# Patient Record
Sex: Male | Born: 1975 | Race: White | Hispanic: No | Marital: Married | Smoking: Never smoker
Health system: Southern US, Community
[De-identification: ages and names within clinical notes are randomized; demographics above are authoritative.]

## PROBLEM LIST (undated history)

## (undated) DIAGNOSIS — Z22322 Carrier or suspected carrier of Methicillin resistant Staphylococcus aureus: Secondary | ICD-10-CM

## (undated) DIAGNOSIS — B1920 Unspecified viral hepatitis C without hepatic coma: Secondary | ICD-10-CM

## (undated) DIAGNOSIS — E669 Obesity, unspecified: Secondary | ICD-10-CM

## (undated) HISTORY — PX: ABDOMINAL SURGERY: SHX537

## (undated) HISTORY — PX: CARPAL TUNNEL RELEASE: SHX101

## (undated) HISTORY — PX: FACIAL RECONSTRUCTION SURGERY: SHX631

---

## 2001-07-02 ENCOUNTER — Emergency Department (HOSPITAL_COMMUNITY): Admission: EM | Admit: 2001-07-02 | Discharge: 2001-07-02 | Payer: Self-pay | Admitting: *Deleted

## 2002-01-29 ENCOUNTER — Emergency Department (HOSPITAL_COMMUNITY): Admission: EM | Admit: 2002-01-29 | Discharge: 2002-01-30 | Payer: Self-pay | Admitting: Emergency Medicine

## 2002-08-09 ENCOUNTER — Emergency Department (HOSPITAL_COMMUNITY): Admission: EM | Admit: 2002-08-09 | Discharge: 2002-08-09 | Payer: Self-pay | Admitting: Emergency Medicine

## 2002-08-10 ENCOUNTER — Encounter (HOSPITAL_COMMUNITY): Admission: RE | Admit: 2002-08-10 | Discharge: 2002-09-09 | Payer: Self-pay | Admitting: Preventative Medicine

## 2002-08-11 ENCOUNTER — Emergency Department (HOSPITAL_COMMUNITY): Admission: EM | Admit: 2002-08-11 | Discharge: 2002-08-11 | Payer: Self-pay | Admitting: *Deleted

## 2002-12-08 ENCOUNTER — Emergency Department (HOSPITAL_COMMUNITY): Admission: EM | Admit: 2002-12-08 | Discharge: 2002-12-09 | Payer: Self-pay | Admitting: Emergency Medicine

## 2002-12-09 ENCOUNTER — Encounter: Payer: Self-pay | Admitting: Emergency Medicine

## 2003-04-07 ENCOUNTER — Emergency Department (HOSPITAL_COMMUNITY): Admission: EM | Admit: 2003-04-07 | Discharge: 2003-04-07 | Payer: Self-pay | Admitting: Internal Medicine

## 2003-06-02 ENCOUNTER — Emergency Department (HOSPITAL_COMMUNITY): Admission: EM | Admit: 2003-06-02 | Discharge: 2003-06-02 | Payer: Self-pay | Admitting: Emergency Medicine

## 2004-09-06 ENCOUNTER — Emergency Department (HOSPITAL_COMMUNITY): Admission: EM | Admit: 2004-09-06 | Discharge: 2004-09-06 | Payer: Self-pay | Admitting: Emergency Medicine

## 2004-10-07 ENCOUNTER — Emergency Department (HOSPITAL_COMMUNITY): Admission: EM | Admit: 2004-10-07 | Discharge: 2004-10-07 | Payer: Self-pay | Admitting: Emergency Medicine

## 2004-12-27 ENCOUNTER — Emergency Department (HOSPITAL_COMMUNITY): Admission: EM | Admit: 2004-12-27 | Discharge: 2004-12-27 | Payer: Self-pay | Admitting: Emergency Medicine

## 2004-12-29 ENCOUNTER — Emergency Department (HOSPITAL_COMMUNITY): Admission: EM | Admit: 2004-12-29 | Discharge: 2004-12-29 | Payer: Self-pay | Admitting: Emergency Medicine

## 2005-02-09 ENCOUNTER — Emergency Department (HOSPITAL_COMMUNITY): Admission: EM | Admit: 2005-02-09 | Discharge: 2005-02-09 | Payer: Self-pay | Admitting: Emergency Medicine

## 2005-08-20 ENCOUNTER — Emergency Department (HOSPITAL_COMMUNITY): Admission: EM | Admit: 2005-08-20 | Discharge: 2005-08-20 | Payer: Self-pay | Admitting: Emergency Medicine

## 2006-02-10 ENCOUNTER — Emergency Department (HOSPITAL_COMMUNITY): Admission: EM | Admit: 2006-02-10 | Discharge: 2006-02-11 | Payer: Self-pay | Admitting: Emergency Medicine

## 2006-02-26 ENCOUNTER — Emergency Department (HOSPITAL_COMMUNITY): Admission: EM | Admit: 2006-02-26 | Discharge: 2006-02-26 | Payer: Self-pay | Admitting: Emergency Medicine

## 2006-05-16 ENCOUNTER — Emergency Department (HOSPITAL_COMMUNITY): Admission: EM | Admit: 2006-05-16 | Discharge: 2006-05-16 | Payer: Self-pay | Admitting: Emergency Medicine

## 2006-06-22 ENCOUNTER — Emergency Department (HOSPITAL_COMMUNITY): Admission: EM | Admit: 2006-06-22 | Discharge: 2006-06-23 | Payer: Self-pay | Admitting: Emergency Medicine

## 2006-06-25 ENCOUNTER — Emergency Department (HOSPITAL_COMMUNITY): Admission: EM | Admit: 2006-06-25 | Discharge: 2006-06-25 | Payer: Self-pay | Admitting: Emergency Medicine

## 2006-07-08 ENCOUNTER — Ambulatory Visit: Payer: Self-pay | Admitting: Orthopedic Surgery

## 2006-07-24 ENCOUNTER — Emergency Department (HOSPITAL_COMMUNITY): Admission: EM | Admit: 2006-07-24 | Discharge: 2006-07-24 | Payer: Self-pay | Admitting: Emergency Medicine

## 2006-08-04 ENCOUNTER — Emergency Department (HOSPITAL_COMMUNITY): Admission: EM | Admit: 2006-08-04 | Discharge: 2006-08-04 | Payer: Self-pay | Admitting: Emergency Medicine

## 2006-08-13 ENCOUNTER — Emergency Department (HOSPITAL_COMMUNITY): Admission: EM | Admit: 2006-08-13 | Discharge: 2006-08-13 | Payer: Self-pay | Admitting: Emergency Medicine

## 2006-08-22 ENCOUNTER — Emergency Department (HOSPITAL_COMMUNITY): Admission: EM | Admit: 2006-08-22 | Discharge: 2006-08-22 | Payer: Self-pay | Admitting: Emergency Medicine

## 2006-09-17 ENCOUNTER — Emergency Department (HOSPITAL_COMMUNITY): Admission: EM | Admit: 2006-09-17 | Discharge: 2006-09-17 | Payer: Self-pay | Admitting: Emergency Medicine

## 2006-09-20 ENCOUNTER — Emergency Department (HOSPITAL_COMMUNITY): Admission: EM | Admit: 2006-09-20 | Discharge: 2006-09-21 | Payer: Self-pay | Admitting: Emergency Medicine

## 2006-09-22 ENCOUNTER — Inpatient Hospital Stay (HOSPITAL_COMMUNITY): Admission: EM | Admit: 2006-09-22 | Discharge: 2006-09-30 | Payer: Self-pay | Admitting: Emergency Medicine

## 2006-09-22 ENCOUNTER — Ambulatory Visit: Payer: Self-pay | Admitting: Orthopedic Surgery

## 2006-10-05 ENCOUNTER — Encounter (HOSPITAL_COMMUNITY): Admission: RE | Admit: 2006-10-05 | Discharge: 2006-11-04 | Payer: Self-pay | Admitting: Orthopedic Surgery

## 2006-10-08 ENCOUNTER — Ambulatory Visit: Payer: Self-pay | Admitting: Orthopedic Surgery

## 2006-10-29 ENCOUNTER — Ambulatory Visit: Payer: Self-pay | Admitting: Orthopedic Surgery

## 2006-11-30 ENCOUNTER — Emergency Department (HOSPITAL_COMMUNITY): Admission: EM | Admit: 2006-11-30 | Discharge: 2006-11-30 | Payer: Self-pay | Admitting: Emergency Medicine

## 2006-12-07 ENCOUNTER — Emergency Department (HOSPITAL_COMMUNITY): Admission: EM | Admit: 2006-12-07 | Discharge: 2006-12-07 | Payer: Self-pay | Admitting: Emergency Medicine

## 2006-12-31 ENCOUNTER — Emergency Department (HOSPITAL_COMMUNITY): Admission: EM | Admit: 2006-12-31 | Discharge: 2006-12-31 | Payer: Self-pay | Admitting: *Deleted

## 2007-08-09 ENCOUNTER — Emergency Department (HOSPITAL_COMMUNITY): Admission: EM | Admit: 2007-08-09 | Discharge: 2007-08-09 | Payer: Self-pay | Admitting: Emergency Medicine

## 2007-09-13 ENCOUNTER — Emergency Department (HOSPITAL_COMMUNITY): Admission: EM | Admit: 2007-09-13 | Discharge: 2007-09-13 | Payer: Self-pay | Admitting: Emergency Medicine

## 2007-10-15 ENCOUNTER — Encounter
Admission: RE | Admit: 2007-10-15 | Discharge: 2007-10-17 | Payer: Self-pay | Admitting: Physical Medicine & Rehabilitation

## 2007-10-17 ENCOUNTER — Ambulatory Visit: Payer: Self-pay | Admitting: Physical Medicine & Rehabilitation

## 2007-12-30 ENCOUNTER — Emergency Department (HOSPITAL_COMMUNITY): Admission: EM | Admit: 2007-12-30 | Discharge: 2007-12-30 | Payer: Self-pay | Admitting: Emergency Medicine

## 2008-02-19 ENCOUNTER — Emergency Department (HOSPITAL_COMMUNITY): Admission: EM | Admit: 2008-02-19 | Discharge: 2008-02-20 | Payer: Self-pay | Admitting: Emergency Medicine

## 2008-02-21 ENCOUNTER — Emergency Department (HOSPITAL_COMMUNITY): Admission: EM | Admit: 2008-02-21 | Discharge: 2008-02-21 | Payer: Self-pay | Admitting: Emergency Medicine

## 2008-03-02 ENCOUNTER — Emergency Department (HOSPITAL_COMMUNITY): Admission: EM | Admit: 2008-03-02 | Discharge: 2008-03-02 | Payer: Self-pay | Admitting: Emergency Medicine

## 2008-03-11 ENCOUNTER — Emergency Department (HOSPITAL_COMMUNITY): Admission: EM | Admit: 2008-03-11 | Discharge: 2008-03-11 | Payer: Self-pay | Admitting: Emergency Medicine

## 2008-04-05 ENCOUNTER — Emergency Department (HOSPITAL_COMMUNITY): Admission: EM | Admit: 2008-04-05 | Discharge: 2008-04-05 | Payer: Self-pay | Admitting: Emergency Medicine

## 2008-04-14 ENCOUNTER — Ambulatory Visit: Admission: RE | Admit: 2008-04-14 | Discharge: 2008-04-14 | Payer: Self-pay | Admitting: Neurology

## 2008-04-27 ENCOUNTER — Emergency Department (HOSPITAL_COMMUNITY): Admission: EM | Admit: 2008-04-27 | Discharge: 2008-04-27 | Payer: Self-pay | Admitting: Emergency Medicine

## 2008-06-10 ENCOUNTER — Ambulatory Visit (HOSPITAL_COMMUNITY): Admission: RE | Admit: 2008-06-10 | Discharge: 2008-06-10 | Payer: Self-pay | Admitting: Neurology

## 2008-09-24 ENCOUNTER — Emergency Department (HOSPITAL_COMMUNITY): Admission: EM | Admit: 2008-09-24 | Discharge: 2008-09-24 | Payer: Self-pay | Admitting: Emergency Medicine

## 2009-03-12 ENCOUNTER — Emergency Department (HOSPITAL_COMMUNITY): Admission: EM | Admit: 2009-03-12 | Discharge: 2009-03-12 | Payer: Self-pay | Admitting: Emergency Medicine

## 2009-03-25 ENCOUNTER — Emergency Department (HOSPITAL_COMMUNITY): Admission: EM | Admit: 2009-03-25 | Discharge: 2009-03-25 | Payer: Self-pay | Admitting: Emergency Medicine

## 2009-03-26 ENCOUNTER — Emergency Department (HOSPITAL_COMMUNITY): Admission: EM | Admit: 2009-03-26 | Discharge: 2009-03-26 | Payer: Self-pay | Admitting: Emergency Medicine

## 2009-03-27 ENCOUNTER — Inpatient Hospital Stay (HOSPITAL_COMMUNITY): Admission: EM | Admit: 2009-03-27 | Discharge: 2009-03-31 | Payer: Self-pay | Admitting: Emergency Medicine

## 2009-08-14 ENCOUNTER — Emergency Department (HOSPITAL_COMMUNITY): Admission: EM | Admit: 2009-08-14 | Discharge: 2009-08-14 | Payer: Self-pay | Admitting: Emergency Medicine

## 2009-09-21 ENCOUNTER — Emergency Department (HOSPITAL_COMMUNITY): Admission: EM | Admit: 2009-09-21 | Discharge: 2009-09-21 | Payer: Self-pay | Admitting: Emergency Medicine

## 2009-10-02 ENCOUNTER — Emergency Department (HOSPITAL_COMMUNITY): Admission: EM | Admit: 2009-10-02 | Discharge: 2009-10-02 | Payer: Self-pay | Admitting: Emergency Medicine

## 2010-04-01 ENCOUNTER — Emergency Department (HOSPITAL_COMMUNITY): Admission: EM | Admit: 2010-04-01 | Discharge: 2010-04-01 | Payer: Self-pay | Admitting: Emergency Medicine

## 2010-05-09 ENCOUNTER — Emergency Department (HOSPITAL_COMMUNITY)
Admission: EM | Admit: 2010-05-09 | Discharge: 2010-05-09 | Payer: Self-pay | Source: Home / Self Care | Admitting: Emergency Medicine

## 2010-06-19 ENCOUNTER — Encounter: Payer: Self-pay | Admitting: Neurology

## 2010-06-20 ENCOUNTER — Emergency Department (HOSPITAL_COMMUNITY)
Admission: EM | Admit: 2010-06-20 | Discharge: 2010-06-20 | Payer: Self-pay | Source: Home / Self Care | Admitting: Emergency Medicine

## 2010-08-17 ENCOUNTER — Emergency Department (HOSPITAL_COMMUNITY)
Admission: EM | Admit: 2010-08-17 | Discharge: 2010-08-17 | Disposition: A | Discharge status: Home / Self Care | Payer: Medicaid Other | Attending: Emergency Medicine | Admitting: Emergency Medicine

## 2010-08-17 ENCOUNTER — Emergency Department (HOSPITAL_COMMUNITY): Payer: Medicaid Other

## 2010-08-17 DIAGNOSIS — IMO0002 Reserved for concepts with insufficient information to code with codable children: Secondary | ICD-10-CM | POA: Insufficient documentation

## 2010-08-17 DIAGNOSIS — J45909 Unspecified asthma, uncomplicated: Secondary | ICD-10-CM | POA: Insufficient documentation

## 2010-08-17 DIAGNOSIS — G8929 Other chronic pain: Secondary | ICD-10-CM | POA: Insufficient documentation

## 2010-08-17 DIAGNOSIS — I10 Essential (primary) hypertension: Secondary | ICD-10-CM | POA: Insufficient documentation

## 2010-08-17 DIAGNOSIS — F1910 Other psychoactive substance abuse, uncomplicated: Secondary | ICD-10-CM | POA: Insufficient documentation

## 2010-08-17 DIAGNOSIS — G4730 Sleep apnea, unspecified: Secondary | ICD-10-CM | POA: Insufficient documentation

## 2010-08-17 LAB — BASIC METABOLIC PANEL
Calcium: 9.2 mg/dL (ref 8.4–10.5)
GFR calc Af Amer: >60 mL/min (ref >60)
GFR calc non Af Amer: >60 mL/min (ref >60)
Potassium: 3.6 mEq/L (ref 3.5–5.1)
Sodium: 134 mEq/L — ABNORMAL LOW (ref 135–145)

## 2010-08-17 LAB — CBC
HCT: 37.5 % — ABNORMAL LOW (ref 39.0–52.0)
MCV: 83.3 fL (ref 78.0–100.0)
RBC: 4.5 MIL/uL (ref 4.22–5.81)
RDW: 13.5 % (ref 11.5–15.5)
WBC: 5.6 10*3/uL (ref 4.0–10.5)

## 2010-08-17 LAB — DIFFERENTIAL
Basophils Absolute: 0 10*3/uL (ref 0.0–0.1)
Lymphocytes Relative: 23 % (ref 12–46)
Lymphs Abs: 1.3 10*3/uL (ref 0.7–4.0)
Neutro Abs: 3.7 10*3/uL (ref 1.7–7.7)
Neutrophils Relative %: 66 % (ref 43–77)

## 2010-08-20 LAB — URINALYSIS, ROUTINE W REFLEX MICROSCOPIC
Glucose, UA: NEGATIVE mg/dL
Ketones, ur: NEGATIVE mg/dL
Protein, ur: NEGATIVE mg/dL
Urobilinogen, UA: 0.2 mg/dL (ref 0.0–1.0)

## 2010-08-20 LAB — URINE MICROSCOPIC-ADD ON

## 2010-08-20 LAB — WOUND CULTURE: Culture: NO GROWTH

## 2010-08-30 LAB — BASIC METABOLIC PANEL
BUN: 9 mg/dL (ref 6–23)
CO2: 24 mEq/L (ref 19–32)
CO2: 27 mEq/L (ref 19–32)
Calcium: 9.3 mg/dL (ref 8.4–10.5)
Chloride: 101 mEq/L (ref 96–112)
Chloride: 103 mEq/L (ref 96–112)
Chloride: 104 mEq/L (ref 96–112)
GFR calc Af Amer: >60 mL/min (ref >60)
GFR calc Af Amer: >60 mL/min (ref >60)
Glucose, Bld: 103 mg/dL — ABNORMAL HIGH (ref 70–99)
Glucose, Bld: 126 mg/dL — ABNORMAL HIGH (ref 70–99)
Glucose, Bld: 137 mg/dL — ABNORMAL HIGH (ref 70–99)
Potassium: 3.5 mEq/L (ref 3.5–5.1)
Potassium: 4.1 mEq/L (ref 3.5–5.1)
Sodium: 133 mEq/L — ABNORMAL LOW (ref 135–145)
Sodium: 137 mEq/L (ref 135–145)

## 2010-08-30 LAB — CULTURE, ROUTINE-ABSCESS

## 2010-08-30 LAB — MRSA CULTURE

## 2010-08-30 LAB — CBC
HCT: 36.3 % — ABNORMAL LOW (ref 39.0–52.0)
HCT: 36.4 % — ABNORMAL LOW (ref 39.0–52.0)
HCT: 38 % — ABNORMAL LOW (ref 39.0–52.0)
Hemoglobin: 12.5 g/dL — ABNORMAL LOW (ref 13.0–17.0)
Hemoglobin: 13.1 g/dL (ref 13.0–17.0)
MCHC: 34.4 g/dL (ref 30.0–36.0)
MCHC: 34.4 g/dL (ref 30.0–36.0)
MCHC: 34.4 g/dL (ref 30.0–36.0)
MCV: 89.5 fL (ref 78.0–100.0)
MCV: 89.8 fL (ref 78.0–100.0)
Platelets: 214 10*3/uL (ref 150–400)
RBC: 4.06 MIL/uL — ABNORMAL LOW (ref 4.22–5.81)
RBC: 4.23 MIL/uL (ref 4.22–5.81)
RDW: 12.5 % (ref 11.5–15.5)
RDW: 12.6 % (ref 11.5–15.5)

## 2010-08-30 LAB — DIFFERENTIAL
Basophils Absolute: 0 10*3/uL (ref 0.0–0.1)
Basophils Absolute: 0 10*3/uL (ref 0.0–0.1)
Basophils Relative: 0 % (ref 0–1)
Basophils Relative: 0 % (ref 0–1)
Basophils Relative: 0 % (ref 0–1)
Eosinophils Absolute: 0.2 10*3/uL (ref 0.0–0.7)
Eosinophils Absolute: 0.3 10*3/uL (ref 0.0–0.7)
Eosinophils Relative: 2 % (ref 0–5)
Eosinophils Relative: 2 % (ref 0–5)
Eosinophils Relative: 4 % (ref 0–5)
Monocytes Absolute: 0.5 10*3/uL (ref 0.1–1.0)
Monocytes Absolute: 0.7 10*3/uL (ref 0.1–1.0)
Monocytes Relative: 7 % (ref 3–12)
Monocytes Relative: 8 % (ref 3–12)
Neutro Abs: 7.2 10*3/uL (ref 1.7–7.7)

## 2010-08-30 LAB — ANAEROBIC CULTURE

## 2010-08-31 LAB — CBC
HCT: 40.9 % (ref 39.0–52.0)
Hemoglobin: 14.2 g/dL (ref 13.0–17.0)
MCHC: 34.1 g/dL (ref 30.0–36.0)
MCHC: 34.8 g/dL (ref 30.0–36.0)
Platelets: 189 10*3/uL (ref 150–400)
RBC: 4.36 MIL/uL (ref 4.22–5.81)
RDW: 12.6 % (ref 11.5–15.5)
RDW: 12.8 % (ref 11.5–15.5)

## 2010-08-31 LAB — DIFFERENTIAL
Basophils Absolute: 0 10*3/uL (ref 0.0–0.1)
Eosinophils Absolute: 0.2 10*3/uL (ref 0.0–0.7)
Eosinophils Relative: 0 % (ref 0–5)
Eosinophils Relative: 3 % (ref 0–5)
Lymphocytes Relative: 13 % (ref 12–46)
Lymphocytes Relative: 14 % (ref 12–46)
Lymphs Abs: 1.1 10*3/uL (ref 0.7–4.0)
Monocytes Absolute: 0.4 10*3/uL (ref 0.1–1.0)
Monocytes Absolute: 0.8 10*3/uL (ref 0.1–1.0)
Monocytes Relative: 6 % (ref 3–12)

## 2010-08-31 LAB — CULTURE, BLOOD (ROUTINE X 2)
Culture: NO GROWTH
Culture: NO GROWTH
Report Status: 11052010
Report Status: 11052010

## 2010-08-31 LAB — POCT I-STAT, CHEM 8
BUN: 16 mg/dL (ref 6–23)
Calcium, Ion: 1.13 mmol/L (ref 1.12–1.32)
TCO2: 22 mmol/L (ref 0–100)

## 2010-08-31 LAB — RAPID STREP SCREEN (MED CTR MEBANE ONLY): Streptococcus, Group A Screen (Direct): NEGATIVE

## 2010-08-31 LAB — BASIC METABOLIC PANEL
BUN: 13 mg/dL (ref 6–23)
CO2: 26 mEq/L (ref 19–32)
Calcium: 9.3 mg/dL (ref 8.4–10.5)
Creatinine, Ser: 0.98 mg/dL (ref 0.4–1.5)
GFR calc Af Amer: >60 mL/min (ref >60)
Glucose, Bld: 137 mg/dL — ABNORMAL HIGH (ref 70–99)

## 2010-08-31 LAB — STREP A DNA PROBE

## 2010-10-04 ENCOUNTER — Emergency Department (HOSPITAL_COMMUNITY)
Admission: EM | Admit: 2010-10-04 | Discharge: 2010-10-05 | Disposition: A | Discharge status: Other Acute Inpatient Hospital | Payer: Medicaid Other | Source: Home / Self Care | Attending: Emergency Medicine | Admitting: Emergency Medicine

## 2010-10-04 DIAGNOSIS — F1010 Alcohol abuse, uncomplicated: Secondary | ICD-10-CM | POA: Insufficient documentation

## 2010-10-04 DIAGNOSIS — F1510 Other stimulant abuse, uncomplicated: Secondary | ICD-10-CM | POA: Insufficient documentation

## 2010-10-04 DIAGNOSIS — E669 Obesity, unspecified: Secondary | ICD-10-CM | POA: Insufficient documentation

## 2010-10-04 DIAGNOSIS — Y998 Other external cause status: Secondary | ICD-10-CM | POA: Insufficient documentation

## 2010-10-04 DIAGNOSIS — F1410 Cocaine abuse, uncomplicated: Secondary | ICD-10-CM | POA: Insufficient documentation

## 2010-10-04 DIAGNOSIS — S21109A Unspecified open wound of unspecified front wall of thorax without penetration into thoracic cavity, initial encounter: Secondary | ICD-10-CM | POA: Insufficient documentation

## 2010-10-05 ENCOUNTER — Emergency Department (HOSPITAL_COMMUNITY): Payer: Medicaid Other

## 2010-10-05 ENCOUNTER — Inpatient Hospital Stay (HOSPITAL_COMMUNITY): Payer: Medicaid Other

## 2010-10-05 ENCOUNTER — Inpatient Hospital Stay (HOSPITAL_COMMUNITY)
Admission: EM | Admit: 2010-10-05 | Discharge: 2010-10-12 | DRG: 163 | Disposition: A | Discharge status: Home / Self Care | Payer: Medicaid Other | Attending: General Surgery | Admitting: General Surgery

## 2010-10-05 DIAGNOSIS — J9600 Acute respiratory failure, unspecified whether with hypoxia or hypercapnia: Secondary | ICD-10-CM | POA: Diagnosis not present

## 2010-10-05 DIAGNOSIS — B9689 Other specified bacterial agents as the cause of diseases classified elsewhere: Secondary | ICD-10-CM | POA: Diagnosis present

## 2010-10-05 DIAGNOSIS — R7309 Other abnormal glucose: Secondary | ICD-10-CM | POA: Diagnosis present

## 2010-10-05 DIAGNOSIS — S21309A Unspecified open wound of unspecified front wall of thorax with penetration into thoracic cavity, initial encounter: Principal | ICD-10-CM | POA: Diagnosis present

## 2010-10-05 DIAGNOSIS — M549 Dorsalgia, unspecified: Secondary | ICD-10-CM | POA: Diagnosis present

## 2010-10-05 DIAGNOSIS — K458 Other specified abdominal hernia without obstruction or gangrene: Secondary | ICD-10-CM | POA: Diagnosis present

## 2010-10-05 DIAGNOSIS — F1410 Cocaine abuse, uncomplicated: Secondary | ICD-10-CM | POA: Diagnosis present

## 2010-10-05 DIAGNOSIS — G8929 Other chronic pain: Secondary | ICD-10-CM | POA: Diagnosis present

## 2010-10-05 DIAGNOSIS — D62 Acute posthemorrhagic anemia: Secondary | ICD-10-CM | POA: Diagnosis present

## 2010-10-05 DIAGNOSIS — L02219 Cutaneous abscess of trunk, unspecified: Secondary | ICD-10-CM | POA: Diagnosis present

## 2010-10-05 DIAGNOSIS — S2190XA Unspecified open wound of unspecified part of thorax, initial encounter: Secondary | ICD-10-CM | POA: Diagnosis present

## 2010-10-05 DIAGNOSIS — F17200 Nicotine dependence, unspecified, uncomplicated: Secondary | ICD-10-CM | POA: Diagnosis present

## 2010-10-05 DIAGNOSIS — G4733 Obstructive sleep apnea (adult) (pediatric): Secondary | ICD-10-CM | POA: Diagnosis present

## 2010-10-05 DIAGNOSIS — Z8614 Personal history of Methicillin resistant Staphylococcus aureus infection: Secondary | ICD-10-CM

## 2010-10-05 DIAGNOSIS — S21109A Unspecified open wound of unspecified front wall of thorax without penetration into thoracic cavity, initial encounter: Secondary | ICD-10-CM | POA: Diagnosis present

## 2010-10-05 DIAGNOSIS — J189 Pneumonia, unspecified organism: Secondary | ICD-10-CM | POA: Diagnosis not present

## 2010-10-05 DIAGNOSIS — J45909 Unspecified asthma, uncomplicated: Secondary | ICD-10-CM | POA: Diagnosis present

## 2010-10-05 LAB — CBC
HCT: 31.3 % — ABNORMAL LOW (ref 39.0–52.0)
HCT: 31.5 % — ABNORMAL LOW (ref 39.0–52.0)
Hemoglobin: 10.3 g/dL — ABNORMAL LOW (ref 13.0–17.0)
Hemoglobin: 10.4 g/dL — ABNORMAL LOW (ref 13.0–17.0)
MCH: 27.9 pg (ref 26.0–34.0)
MCHC: 33 g/dL (ref 30.0–36.0)
MCV: 84.5 fL (ref 78.0–100.0)
Platelets: 175 K/uL (ref 150–400)
RBC: 3.76 MIL/uL — ABNORMAL LOW (ref 4.22–5.81)
RBC: 3.73 MIL/uL — ABNORMAL LOW (ref 4.22–5.81)
RDW: 14.7 % (ref 11.5–15.5)
RDW: 14.5 % (ref 11.5–15.5)
WBC: 11.8 10*3/uL — ABNORMAL HIGH (ref 4.0–10.5)
WBC: 9.5 K/uL (ref 4.0–10.5)

## 2010-10-05 LAB — BASIC METABOLIC PANEL WITH GFR
BUN: 14 mg/dL (ref 6–23)
CO2: 22 meq/L (ref 19–32)
Calcium: 7.9 mg/dL — ABNORMAL LOW (ref 8.4–10.5)
Chloride: 107 meq/L (ref 96–112)
Creatinine, Ser: 0.79 mg/dL (ref 0.4–1.5)
GFR calc Af Amer: >60 mL/min (ref >60)
GFR calc non Af Amer: >60 mL/min (ref >60)
Glucose, Bld: 196 mg/dL — ABNORMAL HIGH (ref 70–99)
Potassium: 4.2 meq/L (ref 3.5–5.1)
Sodium: 136 meq/L (ref 135–145)

## 2010-10-05 LAB — GLUCOSE, CAPILLARY
Glucose-Capillary: 175 mg/dL — ABNORMAL HIGH (ref 70–99)
Glucose-Capillary: 146 mg/dL — ABNORMAL HIGH (ref 70–99)
Glucose-Capillary: 102 mg/dL — ABNORMAL HIGH (ref 70–99)
Glucose-Capillary: 123 mg/dL — ABNORMAL HIGH (ref 70–99)
Glucose-Capillary: 119 mg/dL — ABNORMAL HIGH (ref 70–99)

## 2010-10-05 LAB — POCT I-STAT 3, ART BLOOD GAS (G3+)
Patient temperature: 96
TCO2: 26 mmol/L (ref 0–100)
pH, Arterial: 7.359 (ref 7.350–7.450)

## 2010-10-05 LAB — POCT I-STAT 7, (LYTES, BLD GAS, ICA,H+H)
Acid-base deficit: 2 mmol/L (ref 0.0–2.0)
Acid-base deficit: 4 mmol/L — ABNORMAL HIGH (ref 0.0–2.0)
Bicarbonate: 23.8 meq/L (ref 20.0–24.0)
Bicarbonate: 22.3 meq/L (ref 20.0–24.0)
Calcium, Ion: 1.08 mmol/L — ABNORMAL LOW (ref 1.12–1.32)
Calcium, Ion: 1.03 mmol/L — ABNORMAL LOW (ref 1.12–1.32)
HCT: 31 % — ABNORMAL LOW (ref 39.0–52.0)
HCT: 30 % — ABNORMAL LOW (ref 39.0–52.0)
Hemoglobin: 10.5 g/dL — ABNORMAL LOW (ref 13.0–17.0)
Hemoglobin: 10.2 g/dL — ABNORMAL LOW (ref 13.0–17.0)
O2 Saturation: 100 %
O2 Saturation: 100 %
Patient temperature: 36.1
Potassium: 4.6 meq/L (ref 3.5–5.1)
Potassium: 4.1 meq/L (ref 3.5–5.1)
Sodium: 138 meq/L (ref 135–145)
Sodium: 139 meq/L (ref 135–145)
TCO2: 25 mmol/L (ref 0–100)
TCO2: 24 mmol/L (ref 0–100)
pCO2 arterial: 44.7 mmHg (ref 35.0–45.0)
pCO2 arterial: 44.3 mmHg (ref 35.0–45.0)
pH, Arterial: 7.33 — ABNORMAL LOW (ref 7.350–7.450)
pH, Arterial: 7.309 — ABNORMAL LOW (ref 7.350–7.450)
pO2, Arterial: 365 mmHg — ABNORMAL HIGH (ref 80.0–100.0)
pO2, Arterial: 341 mmHg — ABNORMAL HIGH (ref 80.0–100.0)

## 2010-10-05 LAB — ABO/RH: ABO/RH(D): AB POS

## 2010-10-05 LAB — PROTIME-INR
INR: 1.29 (ref 0.00–1.49)
Prothrombin Time: 16.3 s — ABNORMAL HIGH (ref 11.6–15.2)

## 2010-10-05 LAB — DIFFERENTIAL
Basophils Absolute: 0 10*3/uL (ref 0.0–0.1)
Basophils Relative: 0 % (ref 0–1)
Eosinophils Absolute: 0 10*3/uL (ref 0.0–0.7)
Eosinophils Relative: 0 % (ref 0–5)
Neutrophils Relative %: 83 % — ABNORMAL HIGH (ref 43–77)

## 2010-10-05 LAB — COMPREHENSIVE METABOLIC PANEL WITH GFR
ALT: 109 U/L — ABNORMAL HIGH (ref 0–53)
AST: 49 U/L — ABNORMAL HIGH (ref 0–37)
Albumin: 2.8 g/dL — ABNORMAL LOW (ref 3.5–5.2)
Alkaline Phosphatase: 81 U/L (ref 39–117)
BUN: 14 mg/dL (ref 6–23)
CO2: 25 meq/L (ref 19–32)
Calcium: 8.8 mg/dL (ref 8.4–10.5)
Chloride: 104 meq/L (ref 96–112)
Creatinine, Ser: 0.99 mg/dL (ref 0.4–1.5)
GFR calc Af Amer: >60 mL/min (ref >60)
GFR calc non Af Amer: >60 mL/min (ref >60)
Glucose, Bld: 143 mg/dL — ABNORMAL HIGH (ref 70–99)
Potassium: 3.9 meq/L (ref 3.5–5.1)
Sodium: 136 meq/L (ref 135–145)
Total Bilirubin: 0.2 mg/dL — ABNORMAL LOW (ref 0.3–1.2)
Total Protein: 6.5 g/dL (ref 6.0–8.3)

## 2010-10-05 LAB — HEMOGLOBIN AND HEMATOCRIT, BLOOD: HCT: 29.5 % — ABNORMAL LOW (ref 39.0–52.0)

## 2010-10-05 LAB — LACTIC ACID, PLASMA: Lactic Acid, Venous: 0.9 mmol/L (ref 0.5–2.2)

## 2010-10-05 LAB — POCT I-STAT 4, (NA,K, GLUC, HGB,HCT)
HCT: 28 % — ABNORMAL LOW (ref 39.0–52.0)
Hemoglobin: 9.5 g/dL — ABNORMAL LOW (ref 13.0–17.0)
Sodium: 143 meq/L (ref 135–145)

## 2010-10-05 LAB — MRSA PCR SCREENING: MRSA by PCR: NEGATIVE

## 2010-10-05 MED ORDER — IOHEXOL 300 MG/ML  SOLN
100.0000 mL | Freq: Once | INTRAMUSCULAR | Status: AC | PRN
  Administered 2010-10-05: 100 mL via INTRAVENOUS

## 2010-10-06 ENCOUNTER — Inpatient Hospital Stay (HOSPITAL_COMMUNITY): Payer: Medicaid Other

## 2010-10-06 LAB — BASIC METABOLIC PANEL
BUN: 6 mg/dL (ref 6–23)
CO2: 28 mEq/L (ref 19–32)
Calcium: 8.3 mg/dL — ABNORMAL LOW (ref 8.4–10.5)
Chloride: 101 mEq/L (ref 96–112)
Creatinine, Ser: 0.56 mg/dL (ref 0.4–1.5)
GFR calc Af Amer: >60 mL/min (ref >60)

## 2010-10-06 LAB — GLUCOSE, CAPILLARY
Comment 1: 0
Glucose-Capillary: 111 mg/dL — ABNORMAL HIGH (ref 70–99)
Glucose-Capillary: 123 mg/dL — ABNORMAL HIGH (ref 70–99)
Glucose-Capillary: 144 mg/dL — ABNORMAL HIGH (ref 70–99)

## 2010-10-06 LAB — CBC
Hemoglobin: 9 g/dL — ABNORMAL LOW (ref 13.0–17.0)
MCH: 28.6 pg (ref 26.0–34.0)
MCHC: 34 g/dL (ref 30.0–36.0)
MCV: 84.1 fL (ref 78.0–100.0)
RBC: 3.15 MIL/uL — ABNORMAL LOW (ref 4.22–5.81)

## 2010-10-07 ENCOUNTER — Inpatient Hospital Stay (HOSPITAL_COMMUNITY): Payer: Medicaid Other

## 2010-10-07 LAB — GLUCOSE, CAPILLARY: Glucose-Capillary: 135 mg/dL — ABNORMAL HIGH (ref 70–99)

## 2010-10-08 ENCOUNTER — Inpatient Hospital Stay (HOSPITAL_COMMUNITY): Payer: Medicaid Other

## 2010-10-08 LAB — GLUCOSE, CAPILLARY
Glucose-Capillary: 107 mg/dL — ABNORMAL HIGH (ref 70–99)
Glucose-Capillary: 87 mg/dL (ref 70–99)

## 2010-10-08 LAB — CBC
HCT: 28.8 % — ABNORMAL LOW (ref 39.0–52.0)
Hemoglobin: 9.5 g/dL — ABNORMAL LOW (ref 13.0–17.0)
MCH: 27.6 pg (ref 26.0–34.0)
MCHC: 33 g/dL (ref 30.0–36.0)
RBC: 3.44 MIL/uL — ABNORMAL LOW (ref 4.22–5.81)

## 2010-10-09 ENCOUNTER — Inpatient Hospital Stay (HOSPITAL_COMMUNITY): Payer: Medicaid Other

## 2010-10-09 LAB — CROSSMATCH
ABO/RH(D): AB POS
Unit division: 0
Unit division: 0
Unit division: 0
Unit division: 0

## 2010-10-09 LAB — CBC
Hemoglobin: 9.2 g/dL — ABNORMAL LOW (ref 13.0–17.0)
MCH: 28 pg (ref 26.0–34.0)
MCHC: 33 g/dL (ref 30.0–36.0)
MCV: 85.1 fL (ref 78.0–100.0)
RBC: 3.28 MIL/uL — ABNORMAL LOW (ref 4.22–5.81)

## 2010-10-09 LAB — GLUCOSE, CAPILLARY
Glucose-Capillary: 107 mg/dL — ABNORMAL HIGH (ref 70–99)
Glucose-Capillary: 98 mg/dL (ref 70–99)

## 2010-10-09 LAB — HEMOGLOBIN A1C
Hgb A1c MFr Bld: 5.4 % (ref <5.7)
Mean Plasma Glucose: 108 mg/dL (ref <117)

## 2010-10-09 LAB — BASIC METABOLIC PANEL
BUN: 8 mg/dL (ref 6–23)
CO2: 31 mEq/L (ref 19–32)
Calcium: 9 mg/dL (ref 8.4–10.5)
Chloride: 98 mEq/L (ref 96–112)
Creatinine, Ser: 0.69 mg/dL (ref 0.4–1.5)
GFR calc Af Amer: >60 mL/min (ref >60)
Glucose, Bld: 118 mg/dL — ABNORMAL HIGH (ref 70–99)

## 2010-10-10 ENCOUNTER — Inpatient Hospital Stay (HOSPITAL_COMMUNITY): Payer: Medicaid Other

## 2010-10-10 LAB — CULTURE, ROUTINE-ABSCESS

## 2010-10-10 NOTE — Procedures (Signed)
NAME:  SNYDER, COLAVITO             ACCOUNT NO.:  [id number]   MEDICAL RECORD NO.:  [id number]          PATIENT TYPE:  OUT   LOCATION:  SLEE                          FACILITY:  [facility]   PHYSICIAN:  Kofi A. Gerilyn Pilgrim, M.D. DATE OF BIRTH:  1975/12/09   DATE OF PROCEDURE:  04/14/2008  DATE OF DISCHARGE:  04/14/2008                             SLEEP DISORDER REPORT   REFERRING PHYSICIAN:  Beryle Beams, M.D.   INDICATIONS:  A 35 year old male who presents with snoring, hypersomnia,  and is being evaluated for obstructive sleep apnea syndrome.   MEDICATIONS:  1. Percocet.  2. Atenolol.  3. Gabapentin.   BMI:  57   EPWORTH SLEEPINESS SCALE:  23   ARCHITECTURAL SUMMARY:  This is a split night study with the first  portion being a diagnostic and the second a titration.  The total  recording time in the diagnostic is 129 minutes and the titration 263  minutes.  Sleep latency 1 minute.  REM latency 23 minutes.   RESPIRATORY SUMMARY:  Baseline.  Oxygen saturation 98%.  Lowest  saturation is 68%.  The diagnostic AHI is 111.  The patient was titrated  between pressures of 6 and 18.  The optimal pressure is 17 with  elimination of obstructive events.  The pressure is 17.  Patient is also  observed during supine position.   LIMB MOVEMENT SUMMARY:  PLM index 0.   ELECTROCARDIOGRAM SUMMARY:  Average heart rate 77 with no significant  dysrhythmias observed.   IMPRESSION:  Severe obstructive sleep apnea syndrome which responded  well to a CPAP pressure of 17.      Kofi A. Gerilyn Pilgrim, M.D.  Electronically Signed     KAD/MEDQ  D:  04/26/2008  T:  04/26/2008  Job:  [id number]
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
## 2010-10-10 NOTE — Group Therapy Note (Signed)
NAMEROYALE, SWAMY             ACCOUNT NO.:  [id number]   MEDICAL RECORD NO.:  [id number]          PATIENT TYPE:  INP   LOCATION:  A318                          FACILITY:  [facility]   PHYSICIAN:  Vickki Hearing, M.D.DATE OF BIRTH:  Jul 05, 1975   DATE OF PROCEDURE:  09/29/2006  DATE OF DISCHARGE:                                 PROGRESS NOTE   The patient was afebrile.  He did have systolic blood pressure elevation  174/100. Complains of a pain level between 1 and 7.  I restarted his  Toradol his cultures are growing out Staphyloncus aureus, methicillin-  resistant.  Sensitive to vancomycin.  Sensitive to tetracycline.  Sensitive to Bactrim, but only had a level of 10.  Sensitive to  rifampin.  Sensitive to gentamicin.   His outer dressing was removed.  Will start dressing changes on Monday.  He will get a PICC line Monday.  He should be discharged on Monday.      Vickki Hearing, M.D.  Electronically Signed     SEH/MEDQ  D:  09/30/2006  T:  09/30/2006  Job:  [id number]
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
## 2010-10-10 NOTE — Op Note (Signed)
NAMEKAZIMIR, HARTNETT             ACCOUNT NO.:  [id number]   MEDICAL RECORD NO.:  [id number]          PATIENT TYPE:  INP   LOCATION:  A318                          FACILITY:  [facility]   PHYSICIAN:  Vickki Hearing, M.D.DATE OF BIRTH:  Aug 01, 1975   DATE OF PROCEDURE:  DATE OF DISCHARGE:                               OPERATIVE REPORT   PREOPERATIVE DIAGNOSIS:  Fell on left thumb.   POSTOPERATIVE DIAGNOSIS:  Larey Seat on left thumb.   PROCEDURE:  I&D left thumb.   SURGEON:  Vickki Hearing, M.D.   ASSISTANT:  No assistants.   ANESTHETIC:  Bier block.   OPERATIVE FINDINGS:  Purulent material down to the distal phalanx with  necrotic material in the pulp space of the left thumb.   HISTORY:  A 35 year old male presented with a swollen thumb, attempted  self incision and drainage and it became worse. He went to the emergency  room, had a small in incision made in the thumb after some purulent  material had drained. In the emergency room only blood was obtained.   He was admitted, placed an IV antibiotics, soaks were started, bedside  I&D was done. Due to pain, he could not be formally I&D'd as good as I  thought it should be so I brought him to surgery for a formal I&D under  anesthetic.   Peggye Fothergill left thumb marked for surgery.  I countersigned it.  History and physical updated.  The patient taken to the operating room  for a Bier block.  After a successful Bier block prep and drape then  time-out procedure completed.   The previous incision was extended, blunt dissection was carried out  until all the areas of purulent material were drained, necrotic tissue  was debrided.  This brought [place] down to the periosteum of the distal  phalanx.  We irrigated, we packed with saline wet-to-dry dressing,  wrapped in gauze, took the patient back to recovery room.  He is  restarted on vancomycin.  His cultures will be checked over a 48-hour  period.      Vickki Hearing,  M.D.  Electronically Signed     SEH/MEDQ  D:  09/27/2006  T:  09/28/2006  Job:  [id number]

## 2010-10-11 ENCOUNTER — Inpatient Hospital Stay (HOSPITAL_COMMUNITY): Payer: Medicaid Other

## 2010-10-11 LAB — EXPECTORATED SPUTUM ASSESSMENT W GRAM STAIN, RFLX TO RESP C

## 2010-10-11 LAB — ANAEROBIC CULTURE

## 2010-10-12 ENCOUNTER — Inpatient Hospital Stay (HOSPITAL_COMMUNITY): Payer: Medicaid Other

## 2010-10-13 LAB — CULTURE, RESPIRATORY W GRAM STAIN: Culture: NORMAL
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
## 2010-11-12 ENCOUNTER — Emergency Department (HOSPITAL_COMMUNITY): Payer: Medicaid Other

## 2010-11-12 ENCOUNTER — Emergency Department (HOSPITAL_COMMUNITY)
Admission: EM | Admit: 2010-11-12 | Discharge: 2010-11-12 | Disposition: A | Discharge status: Home / Self Care | Payer: Medicaid Other | Attending: Emergency Medicine | Admitting: Emergency Medicine

## 2010-11-12 DIAGNOSIS — S20219A Contusion of unspecified front wall of thorax, initial encounter: Secondary | ICD-10-CM | POA: Insufficient documentation

## 2010-11-12 DIAGNOSIS — I2510 Atherosclerotic heart disease of native coronary artery without angina pectoris: Secondary | ICD-10-CM | POA: Insufficient documentation

## 2010-11-12 DIAGNOSIS — E119 Type 2 diabetes mellitus without complications: Secondary | ICD-10-CM | POA: Insufficient documentation

## 2010-11-12 DIAGNOSIS — S20229A Contusion of unspecified back wall of thorax, initial encounter: Secondary | ICD-10-CM | POA: Insufficient documentation

## 2010-11-12 DIAGNOSIS — I10 Essential (primary) hypertension: Secondary | ICD-10-CM | POA: Insufficient documentation

## 2010-11-12 DIAGNOSIS — K219 Gastro-esophageal reflux disease without esophagitis: Secondary | ICD-10-CM | POA: Insufficient documentation

## 2010-11-12 DIAGNOSIS — W11XXXA Fall on and from ladder, initial encounter: Secondary | ICD-10-CM | POA: Insufficient documentation

## 2011-01-09 ENCOUNTER — Emergency Department (HOSPITAL_COMMUNITY)
Admission: EM | Admit: 2011-01-09 | Discharge: 2011-01-09 | Disposition: A | Discharge status: Home / Self Care | Payer: Medicaid Other | Attending: Emergency Medicine | Admitting: Emergency Medicine

## 2011-01-09 ENCOUNTER — Encounter: Payer: Self-pay | Admitting: *Deleted

## 2011-01-09 DIAGNOSIS — L0231 Cutaneous abscess of buttock: Secondary | ICD-10-CM | POA: Insufficient documentation

## 2011-01-09 HISTORY — DX: Carrier or suspected carrier of methicillin resistant Staphylococcus aureus: Z22.322

## 2011-01-09 HISTORY — DX: Obesity, unspecified: E66.9

## 2011-01-09 MED ORDER — OXYCODONE HCL 5 MG PO TABS: ORAL_TABLET | ORAL | Status: DC

## 2011-01-09 MED ORDER — OXYCODONE-ACETAMINOPHEN 5-325 MG PO TABS
1.0000 | ORAL_TABLET | Freq: Once | ORAL | Status: AC
  Administered 2011-01-09: 1 via ORAL
  Filled 2011-01-09: qty 1

## 2011-01-09 MED ORDER — BACITRACIN ZINC 500 UNIT/GM EX OINT
TOPICAL_OINTMENT | Freq: Once | CUTANEOUS | Status: AC
  Administered 2011-01-09: 1 via TOPICAL
  Filled 2011-01-09: qty 0.9

## 2011-01-09 MED ORDER — DOXYCYCLINE HYCLATE 100 MG PO TABS
100.0000 mg | ORAL_TABLET | Freq: Once | ORAL | Status: AC
  Administered 2011-01-09: 100 mg via ORAL
  Filled 2011-01-09: qty 1

## 2011-01-09 MED ORDER — DOXYCYCLINE HYCLATE 100 MG PO CAPS: 100.0000 mg | ORAL_CAPSULE | Freq: Two times a day (BID) | ORAL | Status: AC

## 2011-01-09 NOTE — ED Notes (Signed)
Pt c/o abscess to right side of bottom. Pt states abscess has been draining since this am.

## 2011-01-09 NOTE — ED Provider Notes (Deleted)
I personally performed the services described in this documentation, which was scribed in my presence. The recorded information has been reviewed and considered. No att. providers found   Phu Yardley Lekas, MD 01/09/11 2159 

## 2011-01-09 NOTE — ED Provider Notes (Signed)
History     CSN: [id number] Arrival date & time: 01/09/2011  3:48 PM  Chief Complaint  Patient presents with  . Abscess   Patient is a 35 y.o. male presenting with abscess. The history is provided by the patient. No language interpreter was used.  Abscess  This is a new problem. The current episode started less than one week ago. The onset was sudden. The problem has been gradually worsening. The abscess is present on the right buttock. The problem is moderate. The abscess is characterized by painfulness and redness. The abscess first occurred at home. Pertinent negatives include no fever.    Past Medical History  Diagnosis Date  . MRSA (methicillin resistant staph aureus) culture positive   . Obesity     Past Surgical History  Procedure Date  . Facial reconstruction surgery   . Abdominal surgery     History reviewed. No pertinent family history.  History  Substance Use Topics  . Smoking status: Never Smoker   . Smokeless tobacco: Not on file  . Alcohol Use: No      Review of Systems  Constitutional: Negative for fever.  Skin: Positive for wound.  All other systems reviewed and are negative.    Physical Exam  BP 147/78  Pulse 94  Temp(Src) 98.3 F (36.8 C) (Oral)  Resp 20  Ht 6' (1.829 m)  Wt 325 lb (147.419 kg)  BMI 44.08 kg/m2  SpO2 98%  Physical Exam  Nursing note and vitals reviewed. Constitutional: He is oriented to person, place, and time. Vital signs are normal. He appears well-developed and well-nourished. No distress.  HENT:  Head: Normocephalic and atraumatic.  Right Ear: External ear normal.  Left Ear: External ear normal.  Nose: Nose normal.  Mouth/Throat: No oropharyngeal exudate.  Eyes: Conjunctivae and EOM are normal. Pupils are equal, round, and reactive to light. Right eye exhibits no discharge. Left eye exhibits no discharge. No scleral icterus.  Neck: Normal range of motion. Neck supple. No JVD present. No tracheal deviation present.  No thyromegaly present.  Cardiovascular: Normal rate, regular rhythm, normal heart sounds, intact distal pulses and normal pulses.  Exam reveals no gallop and no friction rub.   No murmur heard. Pulmonary/Chest: Effort normal and breath sounds normal. No stridor. No respiratory distress. He has no wheezes. He has no rales. He exhibits no tenderness.  Abdominal: Soft. Normal appearance and bowel sounds are normal. He exhibits no distension and no mass. There is no tenderness. There is no rebound and no guarding.  Musculoskeletal: Normal range of motion. He exhibits no edema and no tenderness.  Lymphadenopathy:    He has no cervical adenopathy.  Neurological: He is alert and oriented to person, place, and time. He has normal reflexes. No cranial nerve deficit. Coordination normal. GCS eye subscore is 4. GCS verbal subscore is 5. GCS motor subscore is 6.  Reflex Scores:      Tricep reflexes are 2+ on the right side and 2+ on the left side.      Bicep reflexes are 2+ on the right side and 2+ on the left side.      Brachioradialis reflexes are 2+ on the right side and 2+ on the left side.      Patellar reflexes are 2+ on the right side and 2+ on the left side.      Achilles reflexes are 2+ on the right side and 2+ on the left side. Skin: Skin is warm and dry. Lesion noted. No  rash noted. He is not diaphoretic.     Psychiatric: He has a normal mood and affect. His speech is normal and behavior is normal. Judgment and thought content normal. Cognition and memory are normal.    ED Course  Procedures  MDM Prior mrsa HISTORY      Worthy Rancher, [place] 01/09/11 1705

## 2011-01-14 ENCOUNTER — Emergency Department (HOSPITAL_COMMUNITY)
Admission: EM | Admit: 2011-01-14 | Discharge: 2011-01-14 | Discharge status: Against Medical Advice | Payer: Medicaid Other | Attending: Emergency Medicine | Admitting: Emergency Medicine

## 2011-01-14 DIAGNOSIS — Z5320 Procedure and treatment not carried out because of patient's decision for unspecified reasons: Secondary | ICD-10-CM | POA: Insufficient documentation

## 2011-01-14 NOTE — ED Notes (Signed)
Registration reports pt left without being seen 

## 2011-02-06 NOTE — ED Provider Notes (Signed)
Medical screening examination/treatment/procedure(s) were performed by non-physician practitioner and as supervising physician I was immediately available for consultation/collaboration.  Donnetta Hutching, MD 02/06/11 [place]

## 2011-02-12 ENCOUNTER — Emergency Department (HOSPITAL_COMMUNITY)
Admission: EM | Admit: 2011-02-12 | Discharge: 2011-02-12 | Discharge status: Against Medical Advice | Payer: Medicaid Other | Attending: Emergency Medicine | Admitting: Emergency Medicine

## 2011-02-12 ENCOUNTER — Encounter (HOSPITAL_COMMUNITY): Payer: Self-pay | Admitting: *Deleted

## 2011-02-12 DIAGNOSIS — R51 Headache: Secondary | ICD-10-CM | POA: Insufficient documentation
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
## 2011-02-20 LAB — WOUND CULTURE

## 2011-02-26 LAB — CBC
Hemoglobin: 13.9
RBC: 4.46
WBC: 9.5

## 2011-02-26 LAB — BASIC METABOLIC PANEL
CO2: 29
Chloride: 105
Creatinine, Ser: 0.99
GFR calc Af Amer: >60
Potassium: 3.9
Sodium: 138

## 2011-02-26 LAB — MAGNESIUM: Magnesium: 2

## 2011-06-22 ENCOUNTER — Encounter (HOSPITAL_COMMUNITY): Payer: Self-pay | Admitting: *Deleted

## 2011-06-22 ENCOUNTER — Emergency Department (HOSPITAL_COMMUNITY)
Admission: EM | Admit: 2011-06-22 | Discharge: 2011-06-22 | Disposition: A | Discharge status: Home / Self Care | Payer: Medicaid Other | Attending: Emergency Medicine | Admitting: Emergency Medicine

## 2011-06-22 DIAGNOSIS — L03119 Cellulitis of unspecified part of limb: Secondary | ICD-10-CM | POA: Insufficient documentation

## 2011-06-22 DIAGNOSIS — L02419 Cutaneous abscess of limb, unspecified: Secondary | ICD-10-CM | POA: Insufficient documentation

## 2011-06-22 DIAGNOSIS — M79609 Pain in unspecified limb: Secondary | ICD-10-CM | POA: Insufficient documentation

## 2011-06-22 MED ORDER — SULFAMETHOXAZOLE-TRIMETHOPRIM 800-160 MG PO TABS: 1.0000 | ORAL_TABLET | Freq: Two times a day (BID) | ORAL | Status: AC

## 2011-06-22 MED ORDER — SULFAMETHOXAZOLE-TMP DS 800-160 MG PO TABS
1.0000 | ORAL_TABLET | Freq: Once | ORAL | Status: AC
  Administered 2011-06-22: 1 via ORAL
  Filled 2011-06-22: qty 1

## 2011-06-22 MED ORDER — LIDOCAINE-EPINEPHRINE (PF) 1 %-1:200000 IJ SOLN
10.0000 mL | Freq: Once | INTRAMUSCULAR | Status: DC
  Filled 2011-06-22 (×2): qty 10

## 2011-06-22 MED ORDER — IBUPROFEN 800 MG PO TABS
800.0000 mg | ORAL_TABLET | Freq: Once | ORAL | Status: AC
  Administered 2011-06-22: 800 mg via ORAL
  Filled 2011-06-22: qty 1

## 2011-06-22 MED ORDER — OXYCODONE-ACETAMINOPHEN 5-325 MG PO TABS
1.0000 | ORAL_TABLET | Freq: Once | ORAL | Status: AC
  Administered 2011-06-22: 1 via ORAL
  Filled 2011-06-22: qty 1

## 2011-06-22 MED ORDER — AMOXICILLIN-POT CLAVULANATE 875-125 MG PO TABS: 1.0000 | ORAL_TABLET | Freq: Two times a day (BID) | ORAL | Status: AC

## 2011-06-22 MED ORDER — OXYCODONE-ACETAMINOPHEN 5-325 MG PO TABS: 1.0000 | ORAL_TABLET | Freq: Four times a day (QID) | ORAL | Status: AC | PRN

## 2011-06-22 MED ORDER — AMOXICILLIN-POT CLAVULANATE 875-125 MG PO TABS
1.0000 | ORAL_TABLET | Freq: Once | ORAL | Status: AC
  Administered 2011-06-22: 1 via ORAL
  Filled 2011-06-22: qty 1

## 2011-06-22 MED ORDER — ONDANSETRON HCL 4 MG PO TABS
4.0000 mg | ORAL_TABLET | Freq: Once | ORAL | Status: AC
  Administered 2011-06-22: 4 mg via ORAL
  Filled 2011-06-22: qty 1

## 2011-06-22 NOTE — ED Notes (Signed)
Abscess to upper left thigh x 2 days, denies drainage.

## 2011-06-22 NOTE — ED Provider Notes (Signed)
History     CSN: [id number]  Arrival date & time 06/22/11  4098   First MD Initiated Contact with Patient 06/22/11 0932      Chief Complaint  Patient presents with  . Abscess    (Consider location/radiation/quality/duration/timing/severity/associated sxs/prior treatment) Patient is a 36 y.o. male presenting with abscess. The history is provided by the patient.  Abscess  The abscess is present on the left upper leg. The problem is moderate. The abscess is characterized by redness and painfulness. It is unknown what he was exposed to. Pertinent negatives include no decrease in physical activity, not sleeping less, no fever, no vomiting and no cough. There were no sick contacts. He has received no recent medical care.    Past Medical History  Diagnosis Date  . MRSA (methicillin resistant staph aureus) culture positive   . Obesity     Past Surgical History  Procedure Date  . Facial reconstruction surgery   . Abdominal surgery     No family history on file.  History  Substance Use Topics  . Smoking status: Never Smoker   . Smokeless tobacco: Not on file  . Alcohol Use: Yes     denies ETOH tonight      Review of Systems  Constitutional: Negative for fever and activity change.       All ROS Neg except as noted in HPI  HENT: Negative for nosebleeds and neck pain.   Eyes: Negative for photophobia and discharge.  Respiratory: Negative for cough, shortness of breath and wheezing.   Cardiovascular: Negative for chest pain and palpitations.  Gastrointestinal: Negative for vomiting, abdominal pain and blood in stool.  Genitourinary: Negative for dysuria, frequency and hematuria.  Musculoskeletal: Negative for back pain and arthralgias.  Skin: Negative.        ABSCESS  Neurological: Negative for dizziness, seizures and speech difficulty.  Psychiatric/Behavioral: Negative for hallucinations and confusion.    Allergies  Hydrocodone  Home Medications   Current  Outpatient Rx  Name Route Sig Dispense Refill  . DIPHENHYDRAMINE-APAP (SLEEP) 25-500 MG PO TABS Oral Take 2 tablets by mouth once as needed. For pain     . OXYCODONE HCL 5 MG PO TABS  One po q 4-6 hrs prn pain 20 tablet 0    There were no vitals taken for this visit.  Physical Exam  Nursing note and vitals reviewed. Constitutional: He is oriented to person, place, and time. He appears well-developed and well-nourished.  Non-toxic appearance.  HENT:  Head: Normocephalic.  Right Ear: Tympanic membrane and external ear normal.  Left Ear: Tympanic membrane and external ear normal.  Eyes: EOM and lids are normal. Pupils are equal, round, and reactive to light.  Neck: Normal range of motion. Neck supple. Carotid bruit is not present.  Cardiovascular: Normal rate, regular rhythm, normal heart sounds, intact distal pulses and normal pulses.   Pulmonary/Chest: Breath sounds normal. No respiratory distress.  Abdominal: Soft. Bowel sounds are normal. There is no tenderness. There is no guarding.  Musculoskeletal: Normal range of motion.       Red, firm, tender abscess area of the left upper thigh. No red streaking noted. No satellite abscess appreciated. Distal pulses and sensory symmetrical.  Lymphadenopathy:       Head (right side): No submandibular adenopathy present.       Head (left side): No submandibular adenopathy present.    He has no cervical adenopathy.  Neurological: He is alert and oriented to person, place, and time. He has  normal strength. No cranial nerve deficit or sensory deficit.  Skin: Skin is warm and dry.  Psychiatric: He has a normal mood and affect. His speech is normal.    ED Course  INCISION AND DRAINAGE Date/Time: 06/22/2011 10:10 AM Performed by: Kathie Dike Authorized by: Kathie Dike Consent: Verbal consent obtained. Risks and benefits: risks, benefits and alternatives were discussed Consent given by: patient Patient understanding: patient states  understanding of the procedure being performed Patient identity confirmed: verbally with patient Time out: Immediately prior to procedure a "time out" was called to verify the correct patient, procedure, equipment, support staff and site/side marked as required. Type: abscess Body area: lower extremity (Left upper thigh) Anesthesia: local infiltration Local anesthetic: lidocaine 1% with epinephrine Patient sedated: no Needle gauge: 18 Drainage: purulent Drainage amount: aspirated 3ml purulent material. Patient tolerance: Patient tolerated the procedure well with no immediate complications. Comments: Sterile dressing applied by me.culture obtained.   (including critical care time)  Labs Reviewed - No data to display No results found.   Dx: abscess left upper thigh   MDM  I have reviewed nursing notes, vital signs, and all appropriate lab and imaging results for this patient. Patient reports 2-3 days of new abscess to the left upper thigh area. The area is quite firm and tender to touch. Only 3 mL of purulent drainage aspirated. Will use warm saltwater soaks 2 times daily. Augmentin 875, Septra 800, and Percocet 5 mg. Patient to return in abscess gets worse or other changes.       Kathie Dike, [place] 06/22/11 1017

## 2011-06-22 NOTE — ED Provider Notes (Signed)
Medical screening examination/treatment/procedure(s) were performed by non-physician practitioner and as supervising physician I was immediately available for consultation/collaboration.   Lyanne Co, MD 06/22/11 [phone]

## 2011-06-25 LAB — CULTURE, ROUTINE-ABSCESS

## 2011-07-22 ENCOUNTER — Emergency Department (HOSPITAL_COMMUNITY)
Admission: EM | Admit: 2011-07-22 | Discharge: 2011-07-22 | Disposition: A | Discharge status: Home / Self Care | Payer: Medicaid Other | Attending: Emergency Medicine | Admitting: Emergency Medicine

## 2011-07-22 ENCOUNTER — Encounter (HOSPITAL_COMMUNITY): Payer: Self-pay | Admitting: Emergency Medicine

## 2011-07-22 DIAGNOSIS — IMO0002 Reserved for concepts with insufficient information to code with codable children: Secondary | ICD-10-CM | POA: Insufficient documentation

## 2011-07-22 DIAGNOSIS — Z8614 Personal history of Methicillin resistant Staphylococcus aureus infection: Secondary | ICD-10-CM | POA: Insufficient documentation

## 2011-07-22 DIAGNOSIS — L02411 Cutaneous abscess of right axilla: Secondary | ICD-10-CM

## 2011-07-22 MED ORDER — DOXYCYCLINE HYCLATE 100 MG PO CAPS: 100.0000 mg | ORAL_CAPSULE | Freq: Two times a day (BID) | ORAL | Status: AC

## 2011-07-22 MED ORDER — OXYCODONE-ACETAMINOPHEN 5-325 MG PO TABS: 1.0000 | ORAL_TABLET | ORAL | Status: AC | PRN

## 2011-07-22 NOTE — Discharge Instructions (Signed)
Abscess An abscess (boil or furuncle) is an infected area that contains a collection of pus.  SYMPTOMS Signs and symptoms of an abscess include pain, tenderness, redness, or hardness. You may feel a moveable soft area under your skin. An abscess can occur anywhere in the body.  TREATMENT  A surgical cut (incision) may be made over your abscess to drain the pus. Gauze may be packed into the space or a drain may be looped through the abscess cavity (pocket). This provides a drain that will allow the cavity to heal from the inside outwards. The abscess may be painful for a few days, but should feel much better if it was drained.  Your abscess, if seen early, may not have localized and may not have been drained. If not, another appointment may be required if it does not get better on its own or with medications. HOME CARE INSTRUCTIONS   Only take over-the-counter or prescription medicines for pain, discomfort, or fever as directed by your caregiver.   Take your antibiotics as directed if they were prescribed. Finish them even if you start to feel better.   Keep the skin and clothes clean around your abscess.   If the abscess was drained, you will need to use gauze dressing to collect any draining pus. Dressings will typically need to be changed 3 or more times a day.   The infection may spread by skin contact with others. Avoid skin contact as much as possible.   Practice good hygiene. This includes regular hand washing, cover any draining skin lesions, and do not share personal care items.   If you participate in sports, do not share athletic equipment, towels, whirlpools, or personal care items. Shower after every practice or tournament.   If a draining area cannot be adequately covered:   Do not participate in sports.   Children should not participate in day care until the wound has healed or drainage stops.   If your caregiver has given you a follow-up appointment, it is very important  to keep that appointment. Not keeping the appointment could result in a much worse infection, chronic or permanent injury, pain, and disability. If there is any problem keeping the appointment, you must call back to this facility for assistance.  SEEK MEDICAL CARE IF:   You develop increased pain, swelling, redness, drainage, or bleeding in the wound site.   You develop signs of generalized infection including muscle aches, chills, fever, or a general ill feeling.   You have an oral temperature above 102 F (38.9 C).  MAKE SURE YOU:   Understand these instructions.   Will watch your condition.   Will get help right away if you are not doing well or get worse.  Document Released: 02/21/2005 Document Revised: 01/24/2011 Document Reviewed: 12/16/2007 [facility] Patient Information 2012 [place], [place].    As discussed,  Take the medicines as prescribed.  Do not drive within 4 hours of taking oxycodone as this will make you sleepy.  Continue using warm compresses and do not squeeze the infection site.  Return for incision if not improving over the next several days.

## 2011-07-22 NOTE — ED Notes (Signed)
Pt states has been previously diagnosed with MRSA and has a "boil in rt armpit". Denies fever.

## 2011-07-22 NOTE — ED Notes (Signed)
Pt c/o abscess under the left arm x 3 days.

## 2011-07-23 NOTE — ED Provider Notes (Signed)
History     CSN: [id number]  Arrival date & time 07/22/11  1433   First MD Initiated Contact with Patient 07/22/11 1537      Chief Complaint  Patient presents with  . Abscess    (Consider location/radiation/quality/duration/timing/severity/associated sxs/prior treatment) Patient is a 36 y.o. male presenting with abscess. The history is provided by the patient.  Abscess  This is a new problem. The current episode started less than one week ago. The onset was gradual. The problem occurs continuously. The problem has been gradually worsening. Affected Location: right axilla. The problem is moderate. The abscess is characterized by redness, swelling and painfulness. Associated with: Has history of mrsa and multiple abscesses,  some have been lanced,  others have healed spontaneously. Pertinent negatives include no anorexia, no fever and no sore throat.    Past Medical History  Diagnosis Date  . MRSA (methicillin resistant staph aureus) culture positive   . Obesity     Past Surgical History  Procedure Date  . Facial reconstruction surgery   . Abdominal surgery     History reviewed. No pertinent family history.  History  Substance Use Topics  . Smoking status: Never Smoker   . Smokeless tobacco: Not on file  . Alcohol Use: No     denies recent use      Review of Systems  Constitutional: Negative for fever.  HENT: Negative for sore throat and neck pain.   Eyes: Negative.   Respiratory: Negative for chest tightness and shortness of breath.   Cardiovascular: Negative for chest pain.  Gastrointestinal: Negative for nausea and anorexia.  Genitourinary: Negative.   Musculoskeletal: Negative for joint swelling and arthralgias.  Skin: Negative for rash.  Neurological: Negative for dizziness, weakness, light-headedness, numbness and headaches.  Hematological: Negative.   Psychiatric/Behavioral: Negative.     Allergies  Hydrocodone  Home Medications   Current  Outpatient Rx  Name Route Sig Dispense Refill  . DIPHENHYDRAMINE-APAP (SLEEP) 25-500 MG PO TABS Oral Take 2 tablets by mouth once as needed. For pain     . DOXYCYCLINE HYCLATE 100 MG PO CAPS Oral Take 1 capsule (100 mg total) by mouth 2 (two) times daily. 20 capsule 0  . OXYCODONE HCL 5 MG PO TABS  One po q 4-6 hrs prn pain 20 tablet 0  . OXYCODONE-ACETAMINOPHEN 5-325 MG PO TABS Oral Take 1 tablet by mouth every 4 (four) hours as needed for pain. 20 tablet 0    BP 147/69  Pulse 88  Temp(Src) 97.2 F (36.2 C) (Oral)  Resp 17  Ht 6' (1.829 m)  Wt 350 lb (158.759 kg)  BMI 47.47 kg/m2  SpO2 100%  Physical Exam  Nursing note and vitals reviewed. Constitutional: He is oriented to person, place, and time. He appears well-developed and well-nourished.  HENT:  Head: Normocephalic and atraumatic.  Eyes: Conjunctivae are normal.  Neck: Normal range of motion. Neck supple.  Cardiovascular: Normal rate, regular rhythm, normal heart sounds and intact distal pulses.   Pulmonary/Chest: Effort normal and breath sounds normal. He has no wheezes.  Abdominal: Soft. Bowel sounds are normal. There is no tenderness.  Musculoskeletal: Normal range of motion.  Lymphadenopathy:    He has no cervical adenopathy.  Neurological: He is alert and oriented to person, place, and time.  Skin: Skin is warm and dry. Rash noted. Rash is pustular. There is erythema.       2 cm indurated erythematous slightly raised lesion with central tenting,  No fluctuance.  No  red streaking or surrounding erythema.  Psychiatric: He has a normal mood and affect.    ED Course  Procedures (including critical care time)  Labs Reviewed - No data to display No results found.   1. Abscess of axilla, right       MDM  Discussed I and D vs warm soaks and abx.  Pt prefers to try warm soaks and abx.  Plan to return in 2-3 days if this does not start draining or resolving.  Doxycycline, oxycodone prescribed.        Candis Musa, PA 07/23/11 2224

## 2011-07-24 NOTE — ED Provider Notes (Signed)
Medical screening examination/treatment/procedure(s) were performed by non-physician practitioner and as supervising physician I was immediately available for consultation/collaboration.  Flint Melter, MD 07/24/11 [phone]

## 2011-09-11 ENCOUNTER — Emergency Department (HOSPITAL_COMMUNITY): Payer: Medicaid Other

## 2011-09-11 ENCOUNTER — Encounter (HOSPITAL_COMMUNITY): Payer: Self-pay

## 2011-09-11 ENCOUNTER — Emergency Department (HOSPITAL_COMMUNITY)
Admission: EM | Admit: 2011-09-11 | Discharge: 2011-09-11 | Disposition: A | Discharge status: Home / Self Care | Payer: Medicaid Other | Attending: Emergency Medicine | Admitting: Emergency Medicine

## 2011-09-11 DIAGNOSIS — M545 Low back pain, unspecified: Secondary | ICD-10-CM | POA: Insufficient documentation

## 2011-09-11 DIAGNOSIS — W010XXA Fall on same level from slipping, tripping and stumbling without subsequent striking against object, initial encounter: Secondary | ICD-10-CM | POA: Insufficient documentation

## 2011-09-11 DIAGNOSIS — T148XXA Other injury of unspecified body region, initial encounter: Secondary | ICD-10-CM

## 2011-09-11 DIAGNOSIS — M549 Dorsalgia, unspecified: Secondary | ICD-10-CM

## 2011-09-11 MED ORDER — HYDROMORPHONE HCL PF 2 MG/ML IJ SOLN
2.0000 mg | Freq: Once | INTRAMUSCULAR | Status: AC
  Administered 2011-09-11: 2 mg via INTRAMUSCULAR
  Filled 2011-09-11: qty 1

## 2011-09-11 MED ORDER — OXYCODONE-ACETAMINOPHEN 5-325 MG PO TABS: 1.0000 | ORAL_TABLET | Freq: Four times a day (QID) | ORAL | Status: AC | PRN

## 2011-09-11 MED ORDER — CYCLOBENZAPRINE HCL 10 MG PO TABS: 10.0000 mg | ORAL_TABLET | Freq: Three times a day (TID) | ORAL | Status: AC | PRN

## 2011-09-11 NOTE — ED Notes (Signed)
Pt slipped in shower and c/o back pain. Pt denies actually falling. C/o sharp pain in back. Ambulated without difficulty.

## 2011-09-11 NOTE — ED Provider Notes (Signed)
History  This chart was scribed for Alexander Lennert, MD by Cherlynn Perches. The patient was seen in room APFT22/APFT22. Patient's care was started at 0934.      CSN: [id number]  Arrival date & time 09/11/11  4098   First MD Initiated Contact with Patient 09/11/11 [phone]      Chief Complaint  Patient presents with  . Back Pain    (Consider location/radiation/quality/duration/timing/severity/associated sxs/prior treatment) Patient is a 36 y.o. male presenting with back pain. The history is provided by the patient. No language interpreter was used.  Back Pain  This is a new problem. The current episode started 3 to 5 hours ago. The problem occurs constantly. The problem has not changed since onset.The pain is associated with falling. The pain is present in the lumbar spine. The quality of the pain is described as stabbing. The pain does not radiate. The pain is at a severity of 8/10. The pain is severe. Pertinent negatives include no chest pain, no fever, no headaches, no abdominal pain, no pelvic pain and no leg pain. He has tried nothing for the symptoms. Risk factors include obesity.   Alexander Atkins is a 36 y.o. male who presents to the Emergency Department complaining of 3 hours of sudden onset, unchanging, severe back pain localized to the lower left back. Pt reports that pain began after slipping this morning. Pt states that he did not completely fall, but "jerked very hard." Pt describes pain as sharp and stabbing and states pain is aggravated with bending forward. Pt reports having back pain several years ago from falling off a machine at work, but has not had pain in a while. Pt denies leg pain, chest pain, abdominal pain, HA, dizziness, and fever. Pt denies smoking and alcohol use.   Past Medical History  Diagnosis Date  . MRSA (methicillin resistant staph aureus) culture positive   . Obesity     Past Surgical History  Procedure Date  . Facial reconstruction surgery   .  Abdominal surgery     No family history on file.  History  Substance Use Topics  . Smoking status: Never Smoker   . Smokeless tobacco: Not on file  . Alcohol Use: No     denies recent use      Review of Systems  Constitutional: Negative for fever and fatigue.  HENT: Negative for congestion, sinus pressure and ear discharge.   Eyes: Negative for discharge.  Respiratory: Negative for cough.   Cardiovascular: Negative for chest pain.  Gastrointestinal: Negative for abdominal pain and diarrhea.  Genitourinary: Negative for frequency, hematuria and pelvic pain.  Musculoskeletal: Positive for back pain.  Skin: Negative for rash.  Neurological: Negative for seizures and headaches.  Hematological: Negative.   Psychiatric/Behavioral: Negative for hallucinations.  All other systems reviewed and are negative.    Allergies  Hydrocodone  Home Medications   Current Outpatient Rx  Name Route Sig Dispense Refill  . DIPHENHYDRAMINE-APAP (SLEEP) 25-500 MG PO TABS Oral Take 2 tablets by mouth once as needed. For pain     . OXYCODONE HCL 5 MG PO TABS  One po q 4-6 hrs prn pain 20 tablet 0    Triage Vitals: BP 141/71  Pulse 76  Temp(Src) 97.6 F (36.4 C) (Oral)  Resp 16  Ht 6' (1.829 m)  Wt 350 lb (158.759 kg)  BMI 47.47 kg/m2  SpO2 100%  Physical Exam  Nursing note and vitals reviewed. Constitutional: He is oriented to person, place, and time.  He appears well-developed and well-nourished.  HENT:  Head: Normocephalic.  Eyes: Conjunctivae are normal.  Neck: No tracheal deviation present.  Cardiovascular: Normal heart sounds.   No murmur heard. Pulmonary/Chest: Effort normal and breath sounds normal.  Musculoskeletal: Normal range of motion. He exhibits tenderness.       Tender in lumbar region of back on left side.  Neurological: He is alert and oriented to person, place, and time.  Skin: Skin is warm.  Psychiatric: He has a normal mood and affect.    ED Course    Procedures (including critical care time)  DIAGNOSTIC STUDIES: Oxygen Saturation is 100% on room air, normal by my interpretation.    COORDINATION OF CARE: 9:55AM - discussed treatment plan with pt and he agrees. 11:13AM - discussed xray with pt and advised pt to be rechecked by doctor next week.    Labs Reviewed - No data to display Dg Lumbar Spine Complete  09/11/2011  *RADIOLOGY REPORT*  Clinical Data: Fall, back pain.  LUMBAR SPINE - COMPLETE 4+ VIEW  Comparison: 11/12/2010  Findings: Stable slight wedged shape of the T12 and L1 vertebral bodies.  I suspect this is developmental/normal variant.  No acute findings.  No malalignment.  Disc bases are maintained.  SI joints are symmetric and unremarkable.  IMPRESSION: No acute bony abnormality.  Original Report Authenticated By: Cyndie Chime, M.D.     No diagnosis found.    MDM        The chart was scribed for me under my direct supervision.  I personally performed the history, physical, and medical decision making and all procedures in the evaluation of this patient.Alexander Lennert, MD 09/11/11 [phone]

## 2011-09-11 NOTE — Discharge Instructions (Signed)
No heavy lifting.  Rest at home 1-2 days.  Follow up next week for recheck

## 2011-09-11 NOTE — ED Notes (Signed)
Pt states hx of back pain. States he was getting water out of the floor with a towel and his foot slipped. Sine then, pain to lower back. Occurred at 0600

## 2011-09-22 ENCOUNTER — Emergency Department (HOSPITAL_COMMUNITY)
Admission: EM | Admit: 2011-09-22 | Discharge: 2011-09-22 | Disposition: A | Discharge status: Home / Self Care | Payer: Medicaid Other | Attending: Emergency Medicine | Admitting: Emergency Medicine

## 2011-09-22 ENCOUNTER — Encounter (HOSPITAL_COMMUNITY): Payer: Self-pay

## 2011-09-22 DIAGNOSIS — M549 Dorsalgia, unspecified: Secondary | ICD-10-CM | POA: Insufficient documentation

## 2011-09-22 DIAGNOSIS — W010XXA Fall on same level from slipping, tripping and stumbling without subsequent striking against object, initial encounter: Secondary | ICD-10-CM | POA: Insufficient documentation

## 2011-09-22 MED ORDER — KETOROLAC TROMETHAMINE 60 MG/2ML IM SOLN
60.0000 mg | Freq: Once | INTRAMUSCULAR | Status: AC
  Administered 2011-09-22: 60 mg via INTRAMUSCULAR
  Filled 2011-09-22: qty 2

## 2011-09-22 MED ORDER — CYCLOBENZAPRINE HCL 10 MG PO TABS: 10.0000 mg | ORAL_TABLET | Freq: Two times a day (BID) | ORAL | Status: AC | PRN

## 2011-09-22 MED ORDER — PREDNISONE 10 MG PO TABS: 20.0000 mg | ORAL_TABLET | Freq: Every day | ORAL | Status: DC

## 2011-09-22 NOTE — ED Provider Notes (Signed)
History     CSN: [id number]  Arrival date & time 09/22/11  4098   First MD Initiated Contact with Patient 09/22/11 0533      Chief Complaint  Patient presents with  . Back Pain    (Consider location/radiation/quality/duration/timing/severity/associated sxs/prior treatment) HPI History provided by patient. States he is home tonight and tripped over a child's toy in the kitchen and injured his lower back. Patient has chronic lower back pain. He denies any weakness or numbness. Location is left side. Quality is sharp. No fevers or chills. No nausea vomiting. No new symptoms. Tells like typical exacerbation of chronic pain. No other injury. mod in severity. Past Medical History  Diagnosis Date  . MRSA (methicillin resistant staph aureus) culture positive   . Obesity     Past Surgical History  Procedure Date  . Facial reconstruction surgery   . Abdominal surgery     No family history on file.  History  Substance Use Topics  . Smoking status: Never Smoker   . Smokeless tobacco: Not on file  . Alcohol Use: No     denies recent use      Review of Systems  Constitutional: Negative for fever and chills.  HENT: Negative for neck pain and neck stiffness.   Eyes: Negative for pain.  Respiratory: Negative for shortness of breath.   Cardiovascular: Negative for chest pain.  Gastrointestinal: Negative for abdominal pain.  Genitourinary: Negative for dysuria.  Musculoskeletal: Positive for back pain.  Skin: Negative for rash.  Neurological: Negative for headaches.  All other systems reviewed and are negative.    Allergies  Hydrocodone  Home Medications   Current Outpatient Rx  Name Route Sig Dispense Refill  . CYCLOBENZAPRINE HCL 10 MG PO TABS Oral Take 1 tablet (10 mg total) by mouth 3 (three) times daily as needed for muscle spasms. 40 tablet 0  . CYCLOBENZAPRINE HCL 10 MG PO TABS Oral Take 1 tablet (10 mg total) by mouth 2 (two) times daily as needed for muscle  spasms. 20 tablet 0  . OXYCODONE HCL 5 MG PO TABS  One po q 4-6 hrs prn pain 20 tablet 0  . OXYCODONE-ACETAMINOPHEN 5-325 MG PO TABS Oral Take 1 tablet by mouth every 6 (six) hours as needed for pain. 30 tablet 0  . PREDNISONE 10 MG PO TABS Oral Take 2 tablets (20 mg total) by mouth daily. 15 tablet 0    BP 126/66  Pulse 79  Temp 98.2 F (36.8 C)  Resp 16  Ht 6' (1.829 m)  Wt 350 lb (158.759 kg)  BMI 47.47 kg/m2  SpO2 99%  Physical Exam  Constitutional: He is oriented to person, place, and time. He appears well-developed and well-nourished.  HENT:  Head: Normocephalic and atraumatic.  Eyes: Conjunctivae and EOM are normal. Pupils are equal, round, and reactive to light.  Neck: Trachea normal. Neck supple. No thyromegaly present.  Cardiovascular: Normal rate, regular rhythm, S1 normal, S2 normal and normal pulses.     No systolic murmur is present   No diastolic murmur is present  Pulses:      Radial pulses are 2+ on the right side, and 2+ on the left side.  Pulmonary/Chest: Effort normal and breath sounds normal. He has no wheezes. He has no rhonchi. He has no rales. He exhibits no tenderness.  Abdominal: Soft. Normal appearance and bowel sounds are normal. There is no tenderness. There is no CVA tenderness and negative Murphy's sign.  Musculoskeletal:  Left para lumbar tenderness over lower lumbar region without midline tenderness or deformity. No lower externa deficits with equal strengths, sensorium to light touch and DTRs.  Neurological: He is alert and oriented to person, place, and time. He has normal strength. No cranial nerve deficit or sensory deficit. GCS eye subscore is 4. GCS verbal subscore is 5. GCS motor subscore is 6.  Skin: Skin is warm and dry. No rash noted. He is not diaphoretic.  Psychiatric: His speech is normal.       Cooperative and appropriate    ED Course  Procedures (including critical care time)    1. Back pain      MDM   Acute  exacerbation of chronic back pain.  Toradol for pain. Ice applied to area of injury.Old records reviewed. Multiple ED visits with narcotic seeking behavior and narcotics avoided given situation.  No Features of presentation suggesting need for emergent MRI of back. No midline tenderness or indication for x-rays at this time.       Sunnie Nielsen, MD 09/22/11 [phone]

## 2011-09-22 NOTE — Discharge Instructions (Signed)
Back Pain, Adult Low back pain is very common. About 1 in 5 people have back pain.The cause of low back pain is rarely dangerous. The pain often gets better over time.About half of people with a sudden onset of back pain feel better in just 2 weeks. About 8 in 10 people feel better by 6 weeks.  CAUSES Some common causes of back pain include:  Strain of the muscles or ligaments supporting the spine.   Wear and tear (degeneration) of the spinal discs.   Arthritis.   Direct injury to the back.  DIAGNOSIS Most of the time, the direct cause of low back pain is not known.However, back pain can be treated effectively even when the exact cause of the pain is unknown.Answering your caregiver's questions about your overall health and symptoms is one of the most accurate ways to make sure the cause of your pain is not dangerous. If your caregiver needs more information, he or she may order lab work or imaging tests (X-rays or MRIs).However, even if imaging tests show changes in your back, this usually does not require surgery. HOME CARE INSTRUCTIONS For many people, back pain returns.Since low back pain is rarely dangerous, it is often a condition that people can learn to manageon their own.   Remain active. It is stressful on the back to sit or stand in one place. Do not sit, drive, or stand in one place for more than 30 minutes at a time. Take short walks on level surfaces as soon as pain allows.Try to increase the length of time you walk each day.   Do not stay in bed.Resting more than 1 or 2 days can delay your recovery.   Do not avoid exercise or work.Your body is made to move.It is not dangerous to be active, even though your back may hurt.Your back will likely heal faster if you return to being active before your pain is gone.   Pay attention to your body when you bend and lift. Many people have less discomfortwhen lifting if they bend their knees, keep the load close to their  bodies,and avoid twisting. Often, the most comfortable positions are those that put less stress on your recovering back.   Find a comfortable position to sleep. Use a firm mattress and lie on your side with your knees slightly bent. If you lie on your back, put a pillow under your knees.   Only take over-the-counter or prescription medicines as directed by your caregiver. Over-the-counter medicines to reduce pain and inflammation are often the most helpful.Your caregiver may prescribe muscle relaxant drugs.These medicines help dull your pain so you can more quickly return to your normal activities and healthy exercise.   Put ice on the injured area.   Put ice in a plastic bag.   Place a towel between your skin and the bag.   Leave the ice on for 15 to 20 minutes, 3 to 4 times a day for the first 2 to 3 days. After that, ice and heat may be alternated to reduce pain and spasms.   Ask your caregiver about trying back exercises and gentle massage. This may be of some benefit.   Avoid feeling anxious or stressed.Stress increases muscle tension and can worsen back pain.It is important to recognize when you are anxious or stressed and learn ways to manage it.Exercise is a great option.  SEEK MEDICAL CARE IF:  You have pain that is not relieved with rest or medicine.   You have   pain that does not improve in 1 week.   You have new symptoms.   You are generally not feeling well.  SEEK IMMEDIATE MEDICAL CARE IF:   You have pain that radiates from your back into your legs.   You develop new bowel or bladder control problems.   You have unusual weakness or numbness in your arms or legs.   You develop nausea or vomiting.   You develop abdominal pain.   

## 2011-09-22 NOTE — ED Notes (Signed)
Pt with complaints of back pain that started PTA after fall. Denies hitting head.

## 2012-02-09 ENCOUNTER — Emergency Department (HOSPITAL_COMMUNITY)
Admission: EM | Admit: 2012-02-09 | Discharge: 2012-02-09 | Disposition: A | Discharge status: Home / Self Care | Payer: Self-pay | Attending: Emergency Medicine | Admitting: Emergency Medicine

## 2012-02-09 ENCOUNTER — Encounter (HOSPITAL_COMMUNITY): Payer: Self-pay | Admitting: Emergency Medicine

## 2012-02-09 DIAGNOSIS — M545 Low back pain, unspecified: Secondary | ICD-10-CM | POA: Insufficient documentation

## 2012-02-09 DIAGNOSIS — R300 Dysuria: Secondary | ICD-10-CM | POA: Insufficient documentation

## 2012-02-09 DIAGNOSIS — R109 Unspecified abdominal pain: Secondary | ICD-10-CM | POA: Insufficient documentation

## 2012-02-09 DIAGNOSIS — Z8614 Personal history of Methicillin resistant Staphylococcus aureus infection: Secondary | ICD-10-CM | POA: Insufficient documentation

## 2012-02-09 DIAGNOSIS — I10 Essential (primary) hypertension: Secondary | ICD-10-CM

## 2012-02-09 LAB — URINALYSIS, ROUTINE W REFLEX MICROSCOPIC
Bilirubin Urine: NEGATIVE
Glucose, UA: NEGATIVE mg/dL
Hgb urine dipstick: NEGATIVE
Ketones, ur: NEGATIVE mg/dL
Leukocytes, UA: NEGATIVE
Nitrite: NEGATIVE
Protein, ur: NEGATIVE mg/dL
Specific Gravity, Urine: >1.03 — ABNORMAL HIGH (ref 1.005–1.030)
Urobilinogen, UA: 4 mg/dL — ABNORMAL HIGH (ref 0.0–1.0)
pH: 6 (ref 5.0–8.0)

## 2012-02-09 MED ORDER — OXYCODONE-ACETAMINOPHEN 5-325 MG PO TABS: 1.0000 | ORAL_TABLET | ORAL | Status: AC | PRN

## 2012-02-09 MED ORDER — HYDROMORPHONE HCL PF 1 MG/ML IJ SOLN
1.0000 mg | Freq: Once | INTRAMUSCULAR | Status: AC
  Administered 2012-02-09: 1 mg via INTRAMUSCULAR
  Filled 2012-02-09: qty 1

## 2012-02-09 MED ORDER — KETOROLAC TROMETHAMINE 60 MG/2ML IM SOLN
60.0000 mg | Freq: Once | INTRAMUSCULAR | Status: AC
  Administered 2012-02-09: 60 mg via INTRAMUSCULAR
  Filled 2012-02-09: qty 2

## 2012-02-09 MED ORDER — OXYCODONE-ACETAMINOPHEN 5-325 MG PO TABS
2.0000 | ORAL_TABLET | Freq: Once | ORAL | Status: AC
  Administered 2012-02-09: 2 via ORAL
  Filled 2012-02-09: qty 2
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
## 2012-02-09 NOTE — Discharge Instructions (Signed)

## 2012-02-09 NOTE — ED Notes (Signed)
Per EMS pt c/o of left flank pain . Pt rates pain 9/10.

## 2012-03-01 ENCOUNTER — Encounter (HOSPITAL_COMMUNITY): Payer: Self-pay | Admitting: Emergency Medicine

## 2012-03-01 ENCOUNTER — Emergency Department (HOSPITAL_COMMUNITY)
Admission: EM | Admit: 2012-03-01 | Discharge: 2012-03-01 | Disposition: A | Discharge status: Home / Self Care | Payer: Self-pay | Attending: Emergency Medicine | Admitting: Emergency Medicine

## 2012-03-01 DIAGNOSIS — Z22322 Carrier or suspected carrier of Methicillin resistant Staphylococcus aureus: Secondary | ICD-10-CM

## 2012-03-01 DIAGNOSIS — L0390 Cellulitis, unspecified: Secondary | ICD-10-CM

## 2012-03-01 DIAGNOSIS — Z8614 Personal history of Methicillin resistant Staphylococcus aureus infection: Secondary | ICD-10-CM | POA: Insufficient documentation

## 2012-03-01 DIAGNOSIS — IMO0002 Reserved for concepts with insufficient information to code with codable children: Secondary | ICD-10-CM | POA: Insufficient documentation

## 2012-03-01 MED ORDER — SULFAMETHOXAZOLE-TMP DS 800-160 MG PO TABS
1.0000 | ORAL_TABLET | Freq: Once | ORAL | Status: AC
  Administered 2012-03-01: 1 via ORAL
  Filled 2012-03-01: qty 1

## 2012-03-01 MED ORDER — OXYCODONE-ACETAMINOPHEN 5-325 MG PO TABS
2.0000 | ORAL_TABLET | Freq: Once | ORAL | Status: AC
  Administered 2012-03-01: 2 via ORAL
  Filled 2012-03-01: qty 2

## 2012-03-01 MED ORDER — CEPHALEXIN 500 MG PO CAPS: 500.0000 mg | ORAL_CAPSULE | Freq: Once | ORAL | Status: DC

## 2012-03-01 MED ORDER — SULFAMETHOXAZOLE-TRIMETHOPRIM 800-160 MG PO TABS: 1.0000 | ORAL_TABLET | Freq: Two times a day (BID) | ORAL | Status: DC

## 2012-03-01 MED ORDER — OXYCODONE-ACETAMINOPHEN 5-325 MG PO TABS: 1.0000 | ORAL_TABLET | Freq: Four times a day (QID) | ORAL | Status: DC | PRN

## 2012-03-01 NOTE — ED Provider Notes (Signed)
History   This chart was scribed for Alexander Lyons, MD scribed by Magnus Sinning. The patient was seen in room APA06/APA06 at 22:55  CSN: [id number]  Arrival date & time 03/01/12  2216    Chief Complaint  Patient presents with  . Rash    (Consider location/radiation/quality/duration/timing/severity/associated sxs/prior treatment) The history is provided by the patient. No language interpreter was used.   [name] is a 36 y.o. male who presents to the Emergency Department complaining of moderate red raised areas consistent with MRSA  onset 2 days with associated pain. Patient states he has MRSA and that typically he is usually given doxycycline to treat or areas are opened.   Past Medical History  Diagnosis Date  . MRSA (methicillin resistant staph aureus) culture positive   . Obesity     Past Surgical History  Procedure Date  . Facial reconstruction surgery   . Abdominal surgery     History reviewed. No pertinent family history.  History  Substance Use Topics  . Smoking status: Never Smoker   . Smokeless tobacco: Not on file  . Alcohol Use: No     denies recent use     Review of Systems  Skin: Positive for rash.   10 Systems reviewed and are negative for acute change except as noted in the HPI. Allergies  Hydrocodone  Home Medications  No current outpatient prescriptions on file.  BP 152/81  Pulse 72  Temp 98 F (36.7 C) (Oral)  SpO2 98%  Physical Exam  Nursing note and vitals reviewed. Constitutional: He is oriented to person, place, and time. He appears well-developed and well-nourished. No distress.  HENT:  Head: Normocephalic and atraumatic.  Eyes: Conjunctivae normal and EOM are normal.  Neck: Normal range of motion. Neck supple. No tracheal deviation present.  Cardiovascular: Normal rate.   Pulmonary/Chest: Effort normal. No respiratory distress.  Abdominal: He exhibits no distension.  Musculoskeletal: Normal range of motion.    Neurological: He is alert and oriented to person, place, and time. No sensory deficit.  Skin: Skin is dry. He is not diaphoretic.       Left forearm has a 1.5 cm round firm erythematous tender area. No fluctuance   Psychiatric: He has a normal mood and affect. His behavior is normal.    ED Course  Procedures (including critical care time) DIAGNOSTIC STUDIES: Oxygen Saturation is 98% on room air, normal by my interpretation.    COORDINATION OF CARE: 23:00: Physical exam performed.   Labs Reviewed - No data to display No results found.   No diagnosis found.    MDM  He has cellulitis, possibly an early abscess to his forearm.  He has had these in the past but does not want me to I and D.  He wants to try antibiotics and pain meds.  I have advised him that this likely will progress and require I and D, however he wants to try medications first.  I will prescribe bactrim and percocet.      I personally performed the services described in this documentation, which was scribed in my presence. The recorded information has been reviewed and considered.           Alexander Lyons, MD 03/01/12 [phone]

## 2012-03-01 NOTE — ED Notes (Signed)
Pt presents to ED with redness to left forearm. Pt states he first noticed area 2 days ago. Pt states area is painful.

## 2012-03-08 ENCOUNTER — Encounter (HOSPITAL_COMMUNITY): Payer: Self-pay | Admitting: *Deleted

## 2012-03-08 ENCOUNTER — Emergency Department (HOSPITAL_COMMUNITY)
Admission: EM | Admit: 2012-03-08 | Discharge: 2012-03-08 | Discharge status: Against Medical Advice | Payer: Self-pay | Attending: Emergency Medicine | Admitting: Emergency Medicine

## 2012-03-08 DIAGNOSIS — K0889 Other specified disorders of teeth and supporting structures: Secondary | ICD-10-CM | POA: Insufficient documentation

## 2012-03-08 NOTE — ED Notes (Signed)
Patient states he cannot stay because it is taking too long and his babysitter needs to leave. Request s/o AMA.

## 2012-03-08 NOTE — ED Notes (Signed)
Pt with dental pain in two different places per pt, started today

## 2012-03-25 ENCOUNTER — Emergency Department (HOSPITAL_COMMUNITY)
Admission: EM | Admit: 2012-03-25 | Discharge: 2012-03-25 | Disposition: A | Discharge status: Home / Self Care | Payer: Self-pay | Attending: Emergency Medicine | Admitting: Emergency Medicine

## 2012-03-25 ENCOUNTER — Encounter (HOSPITAL_COMMUNITY): Payer: Self-pay

## 2012-03-25 DIAGNOSIS — E669 Obesity, unspecified: Secondary | ICD-10-CM | POA: Insufficient documentation

## 2012-03-25 DIAGNOSIS — L03314 Cellulitis of groin: Secondary | ICD-10-CM

## 2012-03-25 DIAGNOSIS — Z8614 Personal history of Methicillin resistant Staphylococcus aureus infection: Secondary | ICD-10-CM | POA: Insufficient documentation

## 2012-03-25 DIAGNOSIS — L03319 Cellulitis of trunk, unspecified: Secondary | ICD-10-CM | POA: Insufficient documentation

## 2012-03-25 DIAGNOSIS — L02219 Cutaneous abscess of trunk, unspecified: Secondary | ICD-10-CM | POA: Insufficient documentation

## 2012-03-25 MED ORDER — OXYCODONE-ACETAMINOPHEN 5-325 MG PO TABS: ORAL_TABLET | ORAL | Status: DC

## 2012-03-25 NOTE — ED Provider Notes (Signed)
History     CSN: [id number]  Arrival date & time 03/25/12  7829   First MD Initiated Contact with Patient 03/25/12 [phone]      Chief Complaint  Patient presents with  . Wound Check    (Consider location/radiation/quality/duration/timing/severity/associated sxs/prior treatment) HPI Comments: Pt has an area in the L groin that he is concerned is an abscess.  The history is provided by the patient. No language interpreter was used.    Past Medical History  Diagnosis Date  . MRSA (methicillin resistant staph aureus) culture positive   . Obesity     Past Surgical History  Procedure Date  . Facial reconstruction surgery   . Abdominal surgery     No family history on file.  History  Substance Use Topics  . Smoking status: Never Smoker   . Smokeless tobacco: Not on file  . Alcohol Use: No     denies recent use      Review of Systems  Constitutional: Negative for fever and chills.  Skin:       Abscess   All other systems reviewed and are negative.    Allergies  Hydrocodone  Home Medications  No current outpatient prescriptions on file.  BP 151/86  Pulse 79  Temp 98 F (36.7 C) (Oral)  Resp 18  SpO2 96%  Physical Exam  Nursing note and vitals reviewed. Constitutional: He is oriented to person, place, and time. He appears well-developed and well-nourished.  HENT:  Head: Normocephalic and atraumatic.  Eyes: EOM are normal.  Neck: Normal range of motion.  Cardiovascular: Normal rate, regular rhythm and intact distal pulses.   Pulmonary/Chest: Effort normal. No respiratory distress.  Abdominal: Soft. He exhibits no distension. There is no tenderness.  Musculoskeletal: Normal range of motion.       Legs: Neurological: He is alert and oriented to person, place, and time.  Skin: Skin is warm and dry. There is erythema.  Psychiatric: He has a normal mood and affect. Judgment normal.    ED Course  Procedures (including critical care time)  Labs  Reviewed - No data to display No results found.   1. Cellulitis of left groin       MDM  Pt states his wife a 10 day supply of doxycycline that she was prescribed but then the MD changed her abx to something else.  He was told to take it BID x 10 days and apply warm compresses.  Return as needed.        Evalina Field, [place] 03/25/12 [phone]

## 2012-03-25 NOTE — ED Notes (Signed)
Pt states that he has a prescription of doxycyline at home

## 2012-03-25 NOTE — ED Notes (Signed)
Pt c/o ?abscess to left upper thigh/groin area that started two days ago, denies any drainage, area is red with black center,

## 2012-03-25 NOTE — ED Provider Notes (Signed)
Medical screening examination/treatment/procedure(s) were performed by non-physician practitioner and as supervising physician I was immediately available for consultation/collaboration.  Shahil Mckaylin Bastien, MD 03/25/12 1535 

## 2012-03-25 NOTE — ED Notes (Signed)
Pt reports ?abcess to left groin area, first noted 3 days ago. H/o mrsa and thinks may be the same.

## 2012-04-27 ENCOUNTER — Emergency Department (HOSPITAL_COMMUNITY)
Admission: EM | Admit: 2012-04-27 | Discharge: 2012-04-27 | Disposition: A | Discharge status: Home / Self Care | Payer: Self-pay | Attending: Emergency Medicine | Admitting: Emergency Medicine

## 2012-04-27 ENCOUNTER — Encounter (HOSPITAL_COMMUNITY): Payer: Self-pay | Admitting: *Deleted

## 2012-04-27 DIAGNOSIS — IMO0002 Reserved for concepts with insufficient information to code with codable children: Secondary | ICD-10-CM | POA: Insufficient documentation

## 2012-04-27 DIAGNOSIS — Z9889 Other specified postprocedural states: Secondary | ICD-10-CM | POA: Insufficient documentation

## 2012-04-27 DIAGNOSIS — E669 Obesity, unspecified: Secondary | ICD-10-CM | POA: Insufficient documentation

## 2012-04-27 DIAGNOSIS — L02411 Cutaneous abscess of right axilla: Secondary | ICD-10-CM

## 2012-04-27 DIAGNOSIS — Z8614 Personal history of Methicillin resistant Staphylococcus aureus infection: Secondary | ICD-10-CM | POA: Insufficient documentation

## 2012-04-27 MED ORDER — SULFAMETHOXAZOLE-TRIMETHOPRIM 800-160 MG PO TABS: 1.0000 | ORAL_TABLET | Freq: Two times a day (BID) | ORAL | Status: DC

## 2012-04-27 MED ORDER — LIDOCAINE HCL (PF) 1 % IJ SOLN
5.0000 mL | Freq: Once | INTRAMUSCULAR | Status: AC
  Administered 2012-04-27: 5 mL
  Filled 2012-04-27 (×2): qty 5

## 2012-04-27 MED ORDER — OXYCODONE-ACETAMINOPHEN 5-325 MG PO TABS: 1.0000 | ORAL_TABLET | ORAL | Status: AC | PRN

## 2012-04-27 MED ORDER — OXYCODONE-ACETAMINOPHEN 5-325 MG PO TABS
2.0000 | ORAL_TABLET | Freq: Once | ORAL | Status: AC
  Administered 2012-04-27: 2 via ORAL
  Filled 2012-04-27: qty 2

## 2012-04-27 NOTE — ED Notes (Signed)
Pt presents with abscess to left axillary area. Pt states he noticed area 3 days ago. Pt reports pain but denies drainage.

## 2012-04-28 NOTE — ED Provider Notes (Signed)
History     CSN: [id number]  Arrival date & time 04/27/12  1845   First MD Initiated Contact with Patient 04/27/12 1955      Chief Complaint  Patient presents with  . Abscess    (Consider location/radiation/quality/duration/timing/severity/associated sxs/prior treatment) Patient is a 36 y.o. male presenting with abscess. The history is provided by the patient.  Abscess  This is a recurrent problem. The current episode started less than one week ago. The onset was gradual. The problem has been gradually worsening. Affected Location: right axilla. The problem is moderate. The abscess is characterized by redness and painfulness (He has applied warm compresses but it has not started draining.). Pertinent negatives include no fever. Associated symptoms comments: He has had no fever, nausea and otherwise feels well.  He does have a history of MRSA.Marland Kitchen    Past Medical History  Diagnosis Date  . MRSA (methicillin resistant staph aureus) culture positive   . Obesity     Past Surgical History  Procedure Date  . Facial reconstruction surgery   . Abdominal surgery     History reviewed. No pertinent family history.  History  Substance Use Topics  . Smoking status: Never Smoker   . Smokeless tobacco: Not on file  . Alcohol Use: No     Comment: denies recent use      Review of Systems  Constitutional: Negative for fever and chills.  HENT: Negative for facial swelling.   Respiratory: Negative for shortness of breath and wheezing.   Skin:       Abscess   Neurological: Negative for numbness.    Allergies  Hydrocodone  Home Medications   Current Outpatient Rx  Name  Route  Sig  Dispense  Refill  . OXYCODONE-ACETAMINOPHEN 5-325 MG PO TABS   Oral   Take 1 tablet by mouth every 4 (four) hours as needed for pain.   20 tablet   0   . SULFAMETHOXAZOLE-TRIMETHOPRIM 800-160 MG PO TABS   Oral   Take 1 tablet by mouth 2 (two) times daily.   28 tablet   0     BP 150/80   Pulse 94  Temp 97.7 F (36.5 C) (Oral)  SpO2 99%  Physical Exam  Constitutional: He appears well-developed and well-nourished. No distress.  HENT:  Head: Normocephalic.  Neck: Neck supple.  Cardiovascular: Normal rate.   Pulmonary/Chest: Effort normal. He has no wheezes.  Musculoskeletal: Normal range of motion. He exhibits no edema.  Skin: There is erythema.       Induration right axilla 2 cm diameter,  Early pointing without drainage,  No fluctuance appreciated.    ED Course  Procedures (including critical care time)   INCISION AND DRAINAGE Performed by: Burgess Amor Consent: Verbal consent obtained. Risks and benefits: risks, benefits and alternatives were discussed Type: abscess  Body area: right axilla  Anesthesia: local infiltration  Incision was made with a scalpel.  Local anesthetic: lidocaine 1% without epinephrine  Anesthetic total: 6 ml  Complexity: complex Blunt dissection to break up loculations  Drainage: purulent  Drainage amount: moderate  Packing material: no packing  Patient tolerance: Patient tolerated the procedure well with no immediate complications.     Labs Reviewed - No data to display No results found.   1. Abscess of axilla, right     Dressing applied by RN.  MDM  Pt encouraged continued warm shower soaks and massage.  Prescribed bactrim, oxycodone.  Recheck prn if sx are not resolving or for any  worsened pain, swelling or development of fever.        Burgess Amor, [place] 04/28/12 [phone]

## 2012-04-28 NOTE — ED Provider Notes (Signed)
Medical screening examination/treatment/procedure(s) were performed by non-physician practitioner and as supervising physician I was immediately available for consultation/collaboration.   Glynn Octave, MD 04/28/12 Rickey Primus

## 2013-11-03 ENCOUNTER — Telehealth: Payer: Self-pay | Admitting: *Deleted

## 2013-11-09 NOTE — Telephone Encounter (Signed)
error 

## 2018-03-27 ENCOUNTER — Other Ambulatory Visit: Payer: Self-pay

## 2018-03-27 ENCOUNTER — Encounter (HOSPITAL_COMMUNITY): Payer: Self-pay

## 2018-03-27 ENCOUNTER — Emergency Department (HOSPITAL_COMMUNITY): Payer: Self-pay

## 2018-03-27 ENCOUNTER — Emergency Department (HOSPITAL_COMMUNITY)
Admission: EM | Admit: 2018-03-27 | Discharge: 2018-03-27 | Disposition: A | Discharge status: Home / Self Care | Payer: Self-pay | Attending: Emergency Medicine | Admitting: Emergency Medicine

## 2018-03-27 DIAGNOSIS — Z79899 Other long term (current) drug therapy: Secondary | ICD-10-CM | POA: Insufficient documentation

## 2018-03-27 DIAGNOSIS — J0110 Acute frontal sinusitis, unspecified: Secondary | ICD-10-CM | POA: Insufficient documentation

## 2018-03-27 DIAGNOSIS — J069 Acute upper respiratory infection, unspecified: Secondary | ICD-10-CM | POA: Insufficient documentation

## 2018-03-27 DIAGNOSIS — J029 Acute pharyngitis, unspecified: Secondary | ICD-10-CM

## 2018-03-27 LAB — GROUP A STREP BY PCR: Group A Strep by PCR: NOT DETECTED

## 2018-03-27 MED ORDER — AMOXICILLIN-POT CLAVULANATE 875-125 MG PO TABS: 1.0000 | ORAL_TABLET | Freq: Two times a day (BID) | ORAL | 0 refills | Status: DC

## 2018-03-27 MED ORDER — FLUTICASONE PROPIONATE 50 MCG/ACT NA SUSP: 2.0000 | Freq: Every day | NASAL | 0 refills | Status: DC

## 2018-03-27 MED ORDER — IBUPROFEN 600 MG PO TABS: 600.0000 mg | ORAL_TABLET | Freq: Four times a day (QID) | ORAL | 0 refills | Status: DC | PRN

## 2018-03-27 MED ORDER — LORATADINE 10 MG PO TABS: 10.0000 mg | ORAL_TABLET | Freq: Every day | ORAL | 0 refills | Status: DC

## 2018-03-27 NOTE — ED Provider Notes (Signed)
[facility] EMERGENCY DEPARTMENT Provider Note   CSN: [id number] Arrival date & time: 03/27/18  1042     History   Chief Complaint Chief Complaint  Patient presents with  . Sore Throat  . Shortness of Breath    HPI Alexander Atkins is a 42 y.o. male.  HPI Patient presents with 3 days of nasal congestion, facial pressure, nonproductive cough, mild shortness of breath with exertion and sore throat.  No fever or chills.  Lower extremity swelling or pain. Past Medical History:  Diagnosis Date  . MRSA (methicillin resistant staph aureus) culture positive   . Obesity     There are no active problems to display for this patient.   Past Surgical History:  Procedure Laterality Date  . ABDOMINAL SURGERY    . FACIAL RECONSTRUCTION SURGERY          Home Medications    Prior to Admission medications   Medication Sig Start Date End Date Taking? Authorizing Provider  amoxicillin-clavulanate (AUGMENTIN) 875-125 MG tablet Take 1 tablet by mouth 2 (two) times daily. One po bid x 7 days 03/27/18   Loren Racer, MD  fluticasone [facility]) 50 MCG/ACT nasal spray Place 2 sprays into both nostrils daily. 03/27/18   Loren Racer, MD  ibuprofen (ADVIL,MOTRIN) 600 MG tablet Take 1 tablet (600 mg total) by mouth every 6 (six) hours as needed. 03/27/18   Loren Racer, MD  loratadine (CLARITIN) 10 MG tablet Take 1 tablet (10 mg total) by mouth daily. 03/27/18   Loren Racer, MD  sulfamethoxazole-trimethoprim (SEPTRA DS) 800-160 MG per tablet Take 1 tablet by mouth 2 (two) times daily. 04/27/12   Burgess Amor, PA-C    Family History No family history on file.  Social History Social History   Tobacco Use  . Smoking status: Never Smoker  Substance Use Topics  . Alcohol use: Yes    Comment: denies recent use  . Drug use: No     Allergies   Hydrocodone   Review of Systems Review of Systems  Constitutional: Positive for fatigue. Negative for chills and fever.    HENT: Positive for congestion, sinus pressure, sinus pain and sore throat. Negative for trouble swallowing.   Respiratory: Positive for cough and shortness of breath.   Cardiovascular: Negative for chest pain and leg swelling.  Gastrointestinal: Negative for abdominal pain, diarrhea, nausea and vomiting.  Genitourinary: Negative for dysuria, flank pain and frequency.  Musculoskeletal: Negative for back pain and myalgias.  Skin: Negative for rash and wound.  Neurological: Negative for dizziness, weakness, light-headedness, numbness and headaches.  All other systems reviewed and are negative.    Physical Exam Updated Vital Signs BP (!) 160/96 (BP Location: Right Arm)   Pulse 66   Temp 98.2 F (36.8 C) (Oral)   Resp 12   Ht 6' (1.829 m)   Wt (!) 181.4 kg   SpO2 99%   BMI 54.25 kg/m   Physical Exam  Constitutional: He is oriented to person, place, and time. He appears well-developed and well-nourished. No distress.  HENT:  Head: Normocephalic and atraumatic.  Mouth/Throat: Oropharynx is clear and moist. No oropharyngeal exudate.  Oropharynx is erythematous.  Mildly erythematous left TM.  Left maxillary sinus tenderness to percussion.  Eyes: Pupils are equal, round, and reactive to light. EOM are normal.  Neck: Normal range of motion. Neck supple. No JVD present.  Cardiovascular: Normal rate and regular rhythm. Exam reveals no gallop and no friction rub.  No murmur heard. Pulmonary/Chest: Effort normal  and breath sounds normal. No stridor. No respiratory distress. He has no wheezes. He has no rales. He exhibits no tenderness.  Abdominal: Soft. Bowel sounds are normal. There is no tenderness. There is no rebound and no guarding.  Musculoskeletal: Normal range of motion. He exhibits no edema or tenderness.  No lower extremity swelling, asymmetry or tenderness.  Lymphadenopathy:    He has no cervical adenopathy.  Neurological: He is alert and oriented to person, place, and time.   Skin: Skin is warm and dry. No rash noted. He is not diaphoretic. No erythema.  Psychiatric: He has a normal mood and affect. His behavior is normal.  Nursing note and vitals reviewed.    ED Treatments / Results  Labs (all labs ordered are listed, but only abnormal results are displayed) Labs Reviewed  GROUP A STREP BY PCR    EKG None  Radiology Dg Chest 2 View  Result Date: 03/27/2018 CLINICAL DATA:  Short of breath and cough for 3 days. EXAM: CHEST - 2 VIEW COMPARISON:  11/12/2010 FINDINGS: Cardiac silhouette is normal in size. Normal mediastinal and hilar contours. Clear lungs.  No pleural effusion or pneumothorax. Skeletal structures are intact IMPRESSION: No active cardiopulmonary disease. Electronically Signed   By: Amie Portland M.D.   On: 03/27/2018 12:15    Procedures Procedures (including critical care time)  Medications Ordered in ED Medications - No data to display   Initial Impression / Assessment and Plan / ED Course  I have reviewed the triage vital signs and the nursing notes.  Pertinent labs & imaging results that were available during my care of the patient were reviewed by me and considered in my medical decision making (see chart for details).    Patient is well-appearing.  Stable vital signs.  Maintaining saturations in the high 90s.  Chest x-ray without acute findings.  Lungs are clear.  Likely URI with sinusitis.  Return precautions given.   Final Clinical Impressions(s) / ED Diagnoses   Final diagnoses:  Acute frontal sinusitis, recurrence not specified  Upper respiratory tract infection, unspecified type  Pharyngitis, unspecified etiology    ED Discharge Orders         Ordered    loratadine (CLARITIN) 10 MG tablet  Daily     03/27/18 1245    fluticasone (FLONASE) 50 MCG/ACT nasal spray  Daily     03/27/18 1245    amoxicillin-clavulanate (AUGMENTIN) 875-125 MG tablet  2 times daily     03/27/18 1245    ibuprofen (ADVIL,MOTRIN) 600 MG  tablet  Every 6 hours PRN     03/27/18 1245           Loren Racer, MD 03/27/18 1247

## 2018-03-27 NOTE — ED Triage Notes (Signed)
Pt reports HA for 3 days then throat became extremely sore. Pt reports coughing and SOB with exertion

## 2018-10-20 ENCOUNTER — Emergency Department (HOSPITAL_COMMUNITY)
Admission: EM | Admit: 2018-10-20 | Discharge: 2018-10-20 | Disposition: A | Discharge status: Home / Self Care | Payer: Self-pay | Attending: Emergency Medicine | Admitting: Emergency Medicine

## 2018-10-20 ENCOUNTER — Other Ambulatory Visit: Payer: Self-pay

## 2018-10-20 ENCOUNTER — Emergency Department (HOSPITAL_COMMUNITY): Payer: Self-pay

## 2018-10-20 ENCOUNTER — Encounter (HOSPITAL_COMMUNITY): Payer: Self-pay | Admitting: *Deleted

## 2018-10-20 DIAGNOSIS — S92355A Nondisplaced fracture of fifth metatarsal bone, left foot, initial encounter for closed fracture: Secondary | ICD-10-CM | POA: Insufficient documentation

## 2018-10-20 DIAGNOSIS — X500XXA Overexertion from strenuous movement or load, initial encounter: Secondary | ICD-10-CM | POA: Insufficient documentation

## 2018-10-20 DIAGNOSIS — S92345A Nondisplaced fracture of fourth metatarsal bone, left foot, initial encounter for closed fracture: Secondary | ICD-10-CM | POA: Insufficient documentation

## 2018-10-20 DIAGNOSIS — Y939 Activity, unspecified: Secondary | ICD-10-CM | POA: Insufficient documentation

## 2018-10-20 DIAGNOSIS — Y999 Unspecified external cause status: Secondary | ICD-10-CM | POA: Insufficient documentation

## 2018-10-20 DIAGNOSIS — Z87891 Personal history of nicotine dependence: Secondary | ICD-10-CM | POA: Insufficient documentation

## 2018-10-20 DIAGNOSIS — Y929 Unspecified place or not applicable: Secondary | ICD-10-CM | POA: Insufficient documentation

## 2018-10-20 MED ORDER — OXYCODONE-ACETAMINOPHEN 5-325 MG PO TABS: 1.0000 | ORAL_TABLET | Freq: Four times a day (QID) | ORAL | 0 refills | Status: DC | PRN

## 2018-10-20 MED ORDER — OXYCODONE-ACETAMINOPHEN 5-325 MG PO TABS
1.0000 | ORAL_TABLET | Freq: Once | ORAL | Status: AC
  Administered 2018-10-20: 1 via ORAL
  Filled 2018-10-20: qty 1

## 2018-10-20 NOTE — ED Provider Notes (Signed)
[facility] EMERGENCY DEPARTMENT Provider Note   CSN: [id number] Arrival date & time: 10/20/18  1729    History   Chief Complaint Chief Complaint  Patient presents with  . Ankle Pain    HPI Alexander Atkins is a 43 y.o. male presenting for evaluation of left ankle pain.  Patient states that 2 AM this morning he was getting out of his car when his left ankle inverted under him.  He reports acute onset lateral ankle and foot pain.  Pain is been constant, worse with palpation and weightbearing.  He tried to go to work, but was unable to do so.  He took 800 mg of ibuprofen at 2 PM without improvement of symptoms.  He has not taken anything else.  Patient states he did not injure anything else, denies pain in his calf or his knee.  He has no medical problems besides obesity, takes no medications daily.  Pain does not radiate.  He denies numbness.  He is not on blood thinners.    HPI  Past Medical History:  Diagnosis Date  . MRSA (methicillin resistant staph aureus) culture positive   . Obesity     There are no active problems to display for this patient.   Past Surgical History:  Procedure Laterality Date  . ABDOMINAL SURGERY    . FACIAL RECONSTRUCTION SURGERY          Home Medications    Prior to Admission medications   Medication Sig Start Date End Date Taking? Authorizing Provider  amoxicillin-clavulanate (AUGMENTIN) 875-125 MG tablet Take 1 tablet by mouth 2 (two) times daily. One po bid x 7 days 03/27/18   Loren RacerYelverton, David, MD  fluticasone [facility](FLONASE) 50 MCG/ACT nasal spray Place 2 sprays into both nostrils daily. 03/27/18   Loren RacerYelverton, David, MD  ibuprofen (ADVIL,MOTRIN) 600 MG tablet Take 1 tablet (600 mg total) by mouth every 6 (six) hours as needed. 03/27/18   Loren RacerYelverton, David, MD  loratadine (CLARITIN) 10 MG tablet Take 1 tablet (10 mg total) by mouth daily. 03/27/18   Loren RacerYelverton, David, MD  oxyCODONE-acetaminophen (PERCOCET/ROXICET) 5-325 MG  tablet Take 1 tablet by mouth every 6 (six) hours as needed for severe pain. 10/20/18   Scotty Weigelt, PA-C  sulfamethoxazole-trimethoprim (SEPTRA DS) 800-160 MG per tablet Take 1 tablet by mouth 2 (two) times daily. 04/27/12   Burgess AmorIdol, Julie, PA-C    Family History No family history on file.  Social History Social History   Tobacco Use  . Smoking status: Never Smoker  . Smokeless tobacco: Former Engineer, [employer]  Substance Use Topics  . Alcohol use: Yes    Comment: denies recent use  . Drug use: No     Allergies   Hydrocodone   Review of Systems Review of Systems  Musculoskeletal: Positive for arthralgias and joint swelling.  Hematological: Does not bruise/bleed easily.     Physical Exam Updated Vital Signs BP (!) 156/84 (BP Location: Left Arm)   Pulse 73   Temp (!) 97.5 F (36.4 C) (Oral)   Resp 16   Ht 6' (1.829 m)   Wt (!) 181.4 kg   SpO2 95%   BMI 54.25 kg/m   Physical Exam Vitals signs and nursing note reviewed.  Constitutional:      General: He is not in acute distress.    Appearance: He is well-developed.     Comments: Obese male in no acute distress  HENT:     Head: Normocephalic and atraumatic.  Neck:  Musculoskeletal: Normal range of motion.  Pulmonary:     Effort: Pulmonary effort is normal.  Abdominal:     General: There is no distension.  Musculoskeletal:        General: Swelling and tenderness present.     Comments: Generalized swelling of the left ankle and foot.  Tenderness palpation of the lateral malleolus and proximal fourth and fifth metatarsals.  Pedal pulses intact bilaterally.  Patient able to wiggle toes.  Unable to range left ankle due to pain.  No tenderness palpation of the Achilles tendon, it is palpable and intact.  No tenderness palpation of calf.  No tenderness palpation of the medial malleolus or the first metatarsal.  Good cap refill of all toes.  Skin:    General: Skin is warm.     Capillary Refill: Capillary refill takes less  than 2 seconds.     Findings: No rash.  Neurological:     Mental Status: He is alert and oriented to person, place, and time.      ED Treatments / Results  Labs (all labs ordered are listed, but only abnormal results are displayed) Labs Reviewed - No data to display  EKG None  Radiology Dg Ankle Complete Left  Result Date: 10/20/2018 CLINICAL DATA:  Pain following twisting injury EXAM: LEFT ANKLE COMPLETE - 3+ VIEW COMPARISON:  December 30, 2007. FINDINGS: Frontal, oblique, and lateral views were obtained. No acute fracture or joint effusion is evident in the ankle region. There is a transverse fracture of the proximal fifth metatarsal. There is osteoarthritic change laterally and anteriorly with prominent spurs arising from the anterior tibia as well as exostosis formation along the dorsal talus, stable. There is also spurring in the dorsal midfoot. Ankle mortise appears grossly intact. There is an inferior calcaneal spur. IMPRESSION: Transverse fracture of the proximal fifth metatarsal. No evident acute ankle fracture. Multifocal osteoarthritic change, progressed from 2009. Ankle mortise appears grossly intact. There is an inferior calcaneal spur. Electronically Signed   By: Bretta Bang III M.D.   On: 10/20/2018 19:14   Dg Foot Complete Left  Result Date: 10/20/2018 CLINICAL DATA:  Pain following twisting injury EXAM: LEFT FOOT - COMPLETE 3+ VIEW COMPARISON:  September 24, 2008 left foot radiographs and left ankle radiographs Oct 20, 2018 FINDINGS: Frontal, oblique, and lateral views obtained. There is a transversely oriented fracture of the proximal fifth metatarsal which is better seen on ankle images. There is also a transverse fracture of the proximal fourth metatarsal with alignment anatomic. There is an age uncertain avulsion along the dorsal distal talus. No other fractures are evident. No dislocation. There is osteoarthritic change throughout the dorsal midfoot. No erosive changes.  There is an inferior calcaneal spur. IMPRESSION: Transversely oriented fracture of the proximal fifth metatarsal, better seen on ankle images, with alignment essentially anatomic. Transversely oriented fracture of the proximal fourth metatarsal with alignment anatomic. Apparent small avulsion along the dorsal distal talus. No dislocations. There is moderate spurring throughout the dorsal midfoot. There is an inferior calcaneal spur. Electronically Signed   By: Bretta Bang III M.D.   On: 10/20/2018 19:17    Procedures Procedures (including critical care time)  Medications Ordered in ED Medications  oxyCODONE-acetaminophen (PERCOCET/ROXICET) 5-325 MG per tablet 1 tablet (has no administration in time range)     Initial Impression / Assessment and Plan / ED Course  I have reviewed the triage vital signs and the nursing notes.  Pertinent labs & imaging results that were available during  my care of the patient were reviewed by me and considered in my medical decision making (see chart for details).        Pt presenting for evaluation of L ankle pain.  Physical examination, he is neurovascularly intact.  He does have tenderness and swelling of the left ankle.  X-ray viewed interpreted by me, shows fracture of the proximal fourth and fifth metatarsals, no displacement.  Discussed findings with patient.  Discussed treatment with cam walker, pain control, elevation, and NSAIDs.  PMP checked, patient without controlled substance prescription.  Encouraged follow-up with orthopedics for further evaluation and management.  At this time, patient appears safe for discharge.  Return precautions given.  Patient states he understands agrees plan.   Final Clinical Impressions(s) / ED Diagnoses   Final diagnoses:  Closed nondisplaced fracture of fourth metatarsal bone of left foot, initial encounter  Closed nondisplaced fracture of fifth metatarsal bone of left foot, initial encounter    ED  Discharge Orders         Ordered    oxyCODONE-acetaminophen (PERCOCET/ROXICET) 5-325 MG tablet  Every 6 hours PRN     10/20/18 2045           Alveria Apley, PA-C 10/20/18 2049    Melene Plan, DO 10/20/18 2250

## 2018-10-20 NOTE — ED Triage Notes (Signed)
Pt states he twisted his ankle getting out of the car this am.  States swelling and pain to L ankle.

## 2018-10-20 NOTE — ED Notes (Signed)
Patient verbalizes understanding of discharge instructions. Opportunity for questioning and answers were provided. Armband removed by staff, pt discharged from ED. Prescriptions/pharmacy and follow up care reviewed.

## 2018-10-20 NOTE — ED Notes (Signed)
Ortho tech at bedside 

## 2018-10-20 NOTE — ED Notes (Signed)
Pt given crutches to assist with ambulation purposes with cam walker

## 2018-10-20 NOTE — Discharge Instructions (Signed)
Take ibuprofen 3 times a day with meals.  Do not take other anti-inflammatories at the same time (Advil, Motrin, naproxen, Aleve). You may supplement with Tylenol if you need further pain control. Use Percocet as needed for severe or breakthrough pain.  Have caution, this is narcotic pain medicine.  Do not drive or operate heavy machinery while taking this medication. Use ice packs for pain control.  Keep your foot elevated when able.  Call the orthopedic doctor for further evaluation and management of your foot. Return to the emergency room with any new, worsening, concerning symptoms

## 2018-10-22 ENCOUNTER — Other Ambulatory Visit: Payer: Self-pay | Admitting: Orthopedic Surgery

## 2018-10-22 DIAGNOSIS — M79672 Pain in left foot: Secondary | ICD-10-CM

## 2018-10-22 DIAGNOSIS — M25571 Pain in right ankle and joints of right foot: Secondary | ICD-10-CM | POA: Diagnosis not present

## 2018-10-31 ENCOUNTER — Ambulatory Visit
Admission: RE | Admit: 2018-10-31 | Discharge: 2018-10-31 | Disposition: A | Discharge status: Home / Self Care | Payer: BC Managed Care – PPO | Source: Ambulatory Visit | Attending: Orthopedic Surgery | Admitting: Orthopedic Surgery

## 2018-10-31 DIAGNOSIS — S86312A Strain of muscle(s) and tendon(s) of peroneal muscle group at lower leg level, left leg, initial encounter: Secondary | ICD-10-CM | POA: Diagnosis not present

## 2018-10-31 DIAGNOSIS — M79672 Pain in left foot: Secondary | ICD-10-CM

## 2018-11-19 ENCOUNTER — Other Ambulatory Visit: Payer: Self-pay

## 2018-11-19 ENCOUNTER — Encounter (HOSPITAL_COMMUNITY): Payer: Self-pay | Admitting: Emergency Medicine

## 2018-11-19 ENCOUNTER — Emergency Department (HOSPITAL_COMMUNITY)
Admission: EM | Admit: 2018-11-19 | Discharge: 2018-11-19 | Disposition: A | Discharge status: Home / Self Care | Payer: BC Managed Care – PPO | Attending: Emergency Medicine | Admitting: Emergency Medicine

## 2018-11-19 DIAGNOSIS — L309 Dermatitis, unspecified: Secondary | ICD-10-CM | POA: Diagnosis not present

## 2018-11-19 DIAGNOSIS — R21 Rash and other nonspecific skin eruption: Secondary | ICD-10-CM | POA: Insufficient documentation

## 2018-11-19 DIAGNOSIS — Z87891 Personal history of nicotine dependence: Secondary | ICD-10-CM | POA: Diagnosis not present

## 2018-11-19 DIAGNOSIS — R945 Abnormal results of liver function studies: Secondary | ICD-10-CM | POA: Diagnosis not present

## 2018-11-19 DIAGNOSIS — R748 Abnormal levels of other serum enzymes: Secondary | ICD-10-CM

## 2018-11-19 DIAGNOSIS — R609 Edema, unspecified: Secondary | ICD-10-CM | POA: Diagnosis not present

## 2018-11-19 HISTORY — DX: Unspecified viral hepatitis C without hepatic coma: B19.20

## 2018-11-19 LAB — C-REACTIVE PROTEIN: CRP: 0.9 mg/dL (ref <1.0)

## 2018-11-19 LAB — COMPREHENSIVE METABOLIC PANEL
ALT: 68 U/L — ABNORMAL HIGH (ref 0–44)
AST: 50 U/L — ABNORMAL HIGH (ref 15–41)
Albumin: 3.5 g/dL (ref 3.5–5.0)
Alkaline Phosphatase: 65 U/L (ref 38–126)
Anion gap: 10 (ref 5–15)
BUN: 13 mg/dL (ref 6–20)
CO2: 25 mmol/L (ref 22–32)
Calcium: 9.2 mg/dL (ref 8.9–10.3)
Chloride: 104 mmol/L (ref 98–111)
Creatinine, Ser: 1.06 mg/dL (ref 0.61–1.24)
GFR calc Af Amer: >60 mL/min (ref >60)
GFR calc non Af Amer: >60 mL/min (ref >60)
Glucose, Bld: 108 mg/dL — ABNORMAL HIGH (ref 70–99)
Potassium: 4.3 mmol/L (ref 3.5–5.1)
Sodium: 139 mmol/L (ref 135–145)
Total Bilirubin: 0.6 mg/dL (ref 0.3–1.2)
Total Protein: 6.5 g/dL (ref 6.5–8.1)

## 2018-11-19 LAB — CBC WITH DIFFERENTIAL/PLATELET
Abs Immature Granulocytes: 0.02 10*3/uL (ref 0.00–0.07)
Basophils Absolute: 0 10*3/uL (ref 0.0–0.1)
Basophils Relative: 0 %
Eosinophils Absolute: 0.1 10*3/uL (ref 0.0–0.5)
Eosinophils Relative: 3 %
HCT: 44.1 % (ref 39.0–52.0)
Hemoglobin: 14.6 g/dL (ref 13.0–17.0)
Immature Granulocytes: 0 %
Lymphocytes Relative: 23 %
Lymphs Abs: 1.1 10*3/uL (ref 0.7–4.0)
MCH: 30.7 pg (ref 26.0–34.0)
MCHC: 33.1 g/dL (ref 30.0–36.0)
MCV: 92.6 fL (ref 80.0–100.0)
Monocytes Absolute: 0.5 10*3/uL (ref 0.1–1.0)
Monocytes Relative: 10 %
Neutro Abs: 2.8 10*3/uL (ref 1.7–7.7)
Neutrophils Relative %: 64 %
Platelets: 176 10*3/uL (ref 150–400)
RBC: 4.76 MIL/uL (ref 4.22–5.81)
RDW: 12.5 % (ref 11.5–15.5)
WBC: 4.5 10*3/uL (ref 4.0–10.5)
nRBC: 0 % (ref 0.0–0.2)

## 2018-11-19 LAB — SEDIMENTATION RATE: Sed Rate: 2 mm/hr (ref 0–16)

## 2018-11-19 LAB — PROTIME-INR
INR: 1.1 (ref 0.8–1.2)
Prothrombin Time: 13.6 seconds (ref 11.4–15.2)

## 2018-11-19 MED ORDER — DOXYCYCLINE HYCLATE 100 MG PO CAPS: 100.0000 mg | ORAL_CAPSULE | Freq: Two times a day (BID) | ORAL | 0 refills | Status: DC

## 2018-11-19 MED ORDER — MELOXICAM 15 MG PO TABS: 15.0000 mg | ORAL_TABLET | Freq: Every day | ORAL | 0 refills | Status: DC

## 2018-11-19 NOTE — ED Notes (Signed)
Pt verbalized understanding of discharge paperwork, follow-up care and prescriptions.  

## 2018-11-19 NOTE — ED Triage Notes (Signed)
Pt endorses bilateral leg pain, swelling and redness that started about 3 days ago. Reports the pain is a burning.

## 2018-11-19 NOTE — Discharge Instructions (Signed)
Your lab work showed no significant abnormalities except for mildly elevated liver enzymes.  I am giving you a referral to the [facility] for evaluation of possible treatment of your hepatitis C infection. Secondly you will be started on an antibiotic for treatment of potential tick bite infection or staph infection of the lower extremities.  Please take the entire antibiotic to completion.  Can cause some inflammation of the stomach lining.  If you are having upper abdominal pain please begin taking an over-the-counter acid reducing medication such as Pepcid. Please note that this medication can also make you much more sensitive to sunlight and can cause you to burn easily in the sun.  It is imperative that you protect your skin. Also discharging you with an anti-inflammatory medication for pain.  You may take 1 daily.  You may take Tylenol with this.  Not take any other over-the-counter pain medications.  Call Dr. Maurie Boettcher office to set up an appointment for evaluation of your rash.  You may see any of the doctors or physician assistants at that clinic.  Return for any new or worsening symptoms.

## 2018-11-19 NOTE — ED Provider Notes (Signed)
[facility]   CSN: [id number] Arrival date & time: 11/19/18  0800    History   Chief Complaint Chief Complaint  Patient presents with  . Leg Pain    HPI Alexander Atkins is a 43 y.o. male who presents the emergency department chief complaint of bilateral lower extremity rash.  He has a past medical history of obesity, hepatitis C, MRSA.  Patient first noticed a rash on both of his legs when he was taking a shower 4 days ago.  He describes burning pain in his lower extremities.  He states that the rash has gotten progressively worse.  It has spread up his legs.  He denies presence of the rash on any other surface.  He has been taking ibuprofen for recent ankle sprain however he denies taking aspirin, Goody's powders or other aspirin-containing pain relievers.  He has associated severe bilateral foot pain and states that it is extremely difficult for him to walk since the rash began.  He does not take any blood thinning medications.  He denies fevers, chills, involvement of the mouth or throat, easy bruising or bleeding, hematuria, hemoptysis, melena or hematochezia.  The patient denies known tick bites.  HPI  Past Medical History:  Diagnosis Date  . Hepatitis-C   . MRSA (methicillin resistant staph aureus) culture positive   . Obesity     There are no active problems to display for this patient.   Past Surgical History:  Procedure Laterality Date  . ABDOMINAL SURGERY    . FACIAL RECONSTRUCTION SURGERY          Home Medications    Prior to Admission medications   Medication Sig Start Date End Date Taking? Authorizing Provider  amoxicillin-clavulanate (AUGMENTIN) 875-125 MG tablet Take 1 tablet by mouth 2 (two) times daily. One po bid x 7 days 03/27/18   Loren RacerYelverton, David, MD  fluticasone [facility](FLONASE) 50 MCG/ACT nasal spray Place 2 sprays into both nostrils daily. 03/27/18   Loren RacerYelverton, David, MD  ibuprofen (ADVIL,MOTRIN) 600  MG tablet Take 1 tablet (600 mg total) by mouth every 6 (six) hours as needed. 03/27/18   Loren RacerYelverton, David, MD  loratadine (CLARITIN) 10 MG tablet Take 1 tablet (10 mg total) by mouth daily. 03/27/18   Loren RacerYelverton, David, MD  oxyCODONE-acetaminophen (PERCOCET/ROXICET) 5-325 MG tablet Take 1 tablet by mouth every 6 (six) hours as needed for severe pain. 10/20/18   Caccavale, Sophia, PA-C  sulfamethoxazole-trimethoprim (SEPTRA DS) 800-160 MG per tablet Take 1 tablet by mouth 2 (two) times daily. 04/27/12   Burgess AmorIdol, Julie, PA-C    Family History No family history on file.  Social History Social History   Tobacco Use  . Smoking status: Never Smoker  . Smokeless tobacco: Former Engineer, [employer]  Substance Use Topics  . Alcohol use: Yes    Comment: denies recent use  . Drug use: No     Allergies   Hydrocodone   Review of Systems Review of Systems Ten systems reviewed and are negative for acute change, except as noted in the HPI.    Physical Exam Updated Vital Signs BP (!) 151/85   Pulse 72   Temp 97.9 F (36.6 C) (Oral)   Resp 18   Ht 6' (1.829 m)   Wt (!) 181.4 kg   SpO2 99%   BMI 54.25 kg/m   Physical Exam Vitals signs and nursing note reviewed.  Constitutional:      General: He is not in acute distress.  Appearance: He is well-developed. He is not diaphoretic.  HENT:     Head: Normocephalic and atraumatic.  Eyes:     General: No scleral icterus.    Conjunctiva/sclera: Conjunctivae normal.  Neck:     Musculoskeletal: Normal range of motion and neck supple.  Cardiovascular:     Rate and Rhythm: Normal rate and regular rhythm.     Heart sounds: Normal heart sounds.  Pulmonary:     Effort: Pulmonary effort is normal. No respiratory distress.     Breath sounds: Normal breath sounds.  Abdominal:     Palpations: Abdomen is soft.     Tenderness: There is no abdominal tenderness.  Musculoskeletal:     Right lower leg: Edema present.     Left lower leg: Edema present.      Comments: 2+ pitting edema in the bilateral lower extremity  Skin:    General: Skin is warm and dry.     Findings: Rash present.     Comments: Bilateral petechial, purpuric rash with areas of concentrated violaceous color which is non-blanchable.  There is an in the area where the sock line terminates which is less more pale.  The rash is more prominent on the medial surface of the lower extremities.  It is exquisitely tender to palpation.  Neurological:     Mental Status: He is alert.  Psychiatric:        Behavior: Behavior normal.            ED Treatments / Results  Labs (all labs ordered are listed, but only abnormal results are displayed) Labs Reviewed  COMPREHENSIVE METABOLIC PANEL  CBC WITH DIFFERENTIAL/PLATELET  PROTIME-INR  SEDIMENTATION RATE  C-REACTIVE PROTEIN    EKG None  Radiology No results found.  Procedures Procedures (including critical care time)  Medications Ordered in ED Medications - No data to display   Initial Impression / Assessment and Plan / ED Course  I have reviewed the triage vital signs and the nursing notes.  Pertinent labs & imaging results that were available during my care of the patient were reviewed by me and considered in my medical decision making (see chart for details).       Clinical Course as of Nov 19 839  Wed Nov 18, 5420  [id number] 43 year old male with a past medical history of obesity with bilateral lower extremity rash which is purpuric and petechial in nature.  Differential diagnosis includes [facility] spotted fever, vasculitis, hepatitis C induced liver dysfunction causing vitamin K dependent dysfunction with easy bruising and bleeding,, thrombocytopenia, stasis dermatitis.  I have ordered basic labs, sed rate, CRP, liver enzymes, PT/INR.   [AH]    Clinical Course User Index [AH] Margarita Mail, PA-C    43 year old obese male with a history of hepatitis C.  Patient has a purpuric/petechial rash of the lower  extremities.  He has a history of MRSA and untreated hep C.  I have ordered labs and reviewed them.  His C-reactive protein and sed rate are within normal limits so I have low suspicion for vasculitis.  Normal PT/INR and his AST and ALT are only slightly elevated so I have low suspicion for vitamin K dependent pathway abnormalities causing easy bruising and bleeding.  I reviewed the patient's CBC with differential which shows normal platelet count so I have low suspicion for ITP or other thrombocytopenia as the cause.  Given the fact that we are in the middle of the summertime which is tick season and he has  a petechial rash although he has no fevers or other signs of infection I will cover with 10 days of doxycycline which should also treat MRSA infection if that is the case.  Ultimately I believe this is likely a stasis dermatitis.  Patient is advised to elevate his feet, wear compression stockings and follow closely with his primary care doctor.  I have offered also given the patient a referral to our infectious disease clinic in order to discuss the possibility of treatment for his hep C.  Patient appears appropriate for discharge at this time I have discussed return precautions Final Clinical Impressions(s) / ED Diagnoses   Final diagnoses:  None    ED Discharge Orders    None        Arthor CaptainHarris, Annalese Stiner, PA-C 11/20/18 1344    Raeford RazorKohut, Stephen, MD 11/23/18 1112

## 2018-12-05 DIAGNOSIS — M79672 Pain in left foot: Secondary | ICD-10-CM | POA: Diagnosis not present

## 2018-12-05 DIAGNOSIS — T402X1A Poisoning by other opioids, accidental (unintentional), initial encounter: Secondary | ICD-10-CM | POA: Diagnosis not present

## 2018-12-05 DIAGNOSIS — R55 Syncope and collapse: Secondary | ICD-10-CM | POA: Diagnosis not present

## 2018-12-05 DIAGNOSIS — F17220 Nicotine dependence, chewing tobacco, uncomplicated: Secondary | ICD-10-CM | POA: Diagnosis not present

## 2018-12-05 DIAGNOSIS — T401X1A Poisoning by heroin, accidental (unintentional), initial encounter: Secondary | ICD-10-CM | POA: Diagnosis not present

## 2018-12-16 DIAGNOSIS — Z0001 Encounter for general adult medical examination with abnormal findings: Secondary | ICD-10-CM | POA: Diagnosis not present

## 2018-12-16 DIAGNOSIS — Z1389 Encounter for screening for other disorder: Secondary | ICD-10-CM | POA: Diagnosis not present

## 2018-12-16 DIAGNOSIS — R739 Hyperglycemia, unspecified: Secondary | ICD-10-CM | POA: Diagnosis not present

## 2018-12-16 DIAGNOSIS — R7309 Other abnormal glucose: Secondary | ICD-10-CM | POA: Diagnosis not present

## 2018-12-16 DIAGNOSIS — Z6841 Body Mass Index (BMI) 40.0 and over, adult: Secondary | ICD-10-CM | POA: Diagnosis not present

## 2019-03-19 DIAGNOSIS — Z6841 Body Mass Index (BMI) 40.0 and over, adult: Secondary | ICD-10-CM | POA: Diagnosis not present

## 2019-03-19 DIAGNOSIS — M722 Plantar fascial fibromatosis: Secondary | ICD-10-CM | POA: Diagnosis not present

## 2019-03-31 ENCOUNTER — Ambulatory Visit (INDEPENDENT_AMBULATORY_CARE_PROVIDER_SITE_OTHER): Payer: BC Managed Care – PPO

## 2019-03-31 ENCOUNTER — Other Ambulatory Visit: Payer: Self-pay

## 2019-03-31 ENCOUNTER — Encounter: Payer: Self-pay | Admitting: Podiatry

## 2019-03-31 ENCOUNTER — Ambulatory Visit: Payer: BC Managed Care – PPO | Admitting: Podiatry

## 2019-03-31 VITALS — BP 101/76

## 2019-03-31 DIAGNOSIS — G629 Polyneuropathy, unspecified: Secondary | ICD-10-CM

## 2019-03-31 DIAGNOSIS — R208 Other disturbances of skin sensation: Secondary | ICD-10-CM | POA: Diagnosis not present

## 2019-03-31 DIAGNOSIS — M722 Plantar fascial fibromatosis: Secondary | ICD-10-CM

## 2019-03-31 DIAGNOSIS — S96912S Strain of unspecified muscle and tendon at ankle and foot level, left foot, sequela: Secondary | ICD-10-CM | POA: Diagnosis not present

## 2019-03-31 MED ORDER — TRAMADOL HCL 50 MG PO TABS: 50.0000 mg | ORAL_TABLET | Freq: Two times a day (BID) | ORAL | 0 refills | Status: AC | PRN

## 2019-04-07 ENCOUNTER — Telehealth: Payer: Self-pay | Admitting: *Deleted

## 2019-04-07 DIAGNOSIS — S96912S Strain of unspecified muscle and tendon at ankle and foot level, left foot, sequela: Secondary | ICD-10-CM

## 2019-04-07 DIAGNOSIS — R208 Other disturbances of skin sensation: Secondary | ICD-10-CM

## 2019-04-07 DIAGNOSIS — G629 Polyneuropathy, unspecified: Secondary | ICD-10-CM

## 2019-04-07 NOTE — Telephone Encounter (Signed)
Faxed referral, clinicals and demographics to [facility]. MRI orders to L. Cox, CMA for pre-cert and faxed to [place].

## 2019-04-07 NOTE — Telephone Encounter (Signed)
-----   Message from Trula Slade, DPM sent at 04/07/2019  8:02 AM EST ----- Can you please order a neurology consult due to burning in his feet? Also an MRI of the left ankle due to to peroneal tendon rupture? Thanks.

## 2019-04-07 NOTE — Telephone Encounter (Signed)
Thanks

## 2019-04-07 NOTE — Progress Notes (Signed)
Subjective:   Patient ID: Alexander Atkins, male   DOB: 43 y.o.   MRN: [id number]   HPI 43 year old male presents the office today for 2 concerns.  He states that his initial concern was a 7 quite a bit of burning to both feet.  He states that the pain in the burning wakes him up at nighttime and causes significant pain during the day.  He is currently on gabapentin and this is not been overly helpful.  He also has secondary concerns of pain mostly to the outside aspect of the left ankle.  He states he did injure his foot after twisting his foot at work.  He was initially seen at [facility], CT was performed which did reveal a complete rupture of the peroneus longus tendon.  He did not follow back up after the CT scan.  He states the pain is worsened to the point where he said a lot of pain and difficulty walking and walking.  He actually was seen in the emergency room for heroin overdose.  Patient is currently not using any drugs.  He states the pain was so bad that day that he had to do something in order to work.   Review of Systems  All other systems reviewed and are negative.  Past Medical History:  Diagnosis Date  . Hepatitis-C   . MRSA (methicillin resistant staph aureus) culture positive   . Obesity     Past Surgical History:  Procedure Laterality Date  . ABDOMINAL SURGERY    . FACIAL RECONSTRUCTION SURGERY       Current Outpatient Medications:  .  gabapentin (NEURONTIN) 300 MG capsule, Take 900 mg by mouth 3 (three) times daily., Disp: , Rfl:  .  meloxicam (MOBIC) 15 MG tablet, Take 1 tablet (15 mg total) by mouth daily. Take 1 daily with food., Disp: 10 tablet, Rfl: 0 .  doxycycline (VIBRAMYCIN) 100 MG capsule, Take 1 capsule (100 mg total) by mouth 2 (two) times daily. (Patient not taking: Reported on 03/31/2019), Disp: 20 capsule, Rfl: 0 .  fluticasone (FLONASE) 50 MCG/ACT nasal spray, Place 2 sprays into both nostrils daily. (Patient not taking: Reported on 03/31/2019),  Disp: 16 g, Rfl: 0 .  ibuprofen (ADVIL,MOTRIN) 600 MG tablet, Take 1 tablet (600 mg total) by mouth every 6 (six) hours as needed. (Patient not taking: Reported on 03/31/2019), Disp: 30 tablet, Rfl: 0 .  loratadine (CLARITIN) 10 MG tablet, Take 1 tablet (10 mg total) by mouth daily. (Patient not taking: Reported on 03/31/2019), Disp: 30 tablet, Rfl: 0 .  oxyCODONE-acetaminophen (PERCOCET/ROXICET) 5-325 MG tablet, Take 1 tablet by mouth every 6 (six) hours as needed for severe pain. (Patient not taking: Reported on 03/31/2019), Disp: 8 tablet, Rfl: 0 .  sulfamethoxazole-trimethoprim (SEPTRA DS) 800-160 MG per tablet, Take 1 tablet by mouth 2 (two) times daily. (Patient not taking: Reported on 03/31/2019), Disp: 28 tablet, Rfl: 0  Allergies  Allergen Reactions  . Hydrocodone Itching         Objective:  Physical Exam  General: AAO x3, NAD-obese  Dermatological: Skin is warm, dry and supple bilateral. Nails x 10 are well manicured; remaining integument appears unremarkable at this time. There are no open sores, no preulcerative lesions, no rash or signs of infection present.  Vascular: Dorsalis Pedis artery and Posterior Tibial artery pedal pulses are 2/4 bilateral with immedate capillary fill time.  There is no pain with calf compression, swelling, warmth, erythema.   Neruologic: Sensation mildly decreased with  Semmes Weinstein monofilament.  He complains of significant burning to both of his feet.  This is ongoing prior to the injury.  Musculoskeletal: There is significant pain mostly to the lateral aspect of the left ankle.  There is discomfort with eversion of the foot in plantarflexion.  There is also weakness.  There is no area pinpoint tenderness there is diffuse tenderness to the foot and ankle but the majority tenderness appears to be localized to the lateral aspect along the course the peroneal tendons.  Muscular strength 5/5 in all groups tested bilateral.  Gait: Unassisted, Nonantalgic.        Assessment:   Left peroneus longus tendon rupture, neuropathy    Plan:  -Treatment options discussed including all alternatives, risks, and complications -Etiology of symptoms were discussed -X-rays were obtained and reviewed with the patient. There is no evidence of acute fracture or stress fracture identified today. -Regards to the burning pain I will have him follow-up with neurology for this.  Continue gabapentin. -In regards for the left peroneal tendon rupture will order MRI.  This is a chronic issue at this point.  He has a boot at home I also dispensed a Tri-Lock ankle Atkins for him as well.  He is asking for pain medicine.  Given his recent ER visit I was hesitant to do this but he reports he is not currently using any drugs or opioids.  Prescribed small of the tramadol for pain to take as needed only.    Vivi Barrack DPM

## 2019-04-08 NOTE — Telephone Encounter (Signed)
Pt called to see what he can do for pain hes in a lot

## 2019-04-13 ENCOUNTER — Telehealth: Payer: Self-pay | Admitting: *Deleted

## 2019-04-13 NOTE — Telephone Encounter (Signed)
Called [employer] and spoke with Janett Billow and per the representative they do not do a pre-cert review but stated to me that I could call the member service line and see what they say and if there is a pre-cert required then I would have to do a three way call and over ride it. Lattie Haw

## 2019-05-13 ENCOUNTER — Telehealth: Payer: Self-pay | Admitting: *Deleted

## 2019-05-13 ENCOUNTER — Telehealth: Payer: Self-pay | Admitting: Podiatry

## 2019-05-13 ENCOUNTER — Other Ambulatory Visit: Payer: Self-pay | Admitting: Podiatry

## 2019-05-13 MED ORDER — TRAMADOL HCL 50 MG PO TABS: 50.0000 mg | ORAL_TABLET | Freq: Three times a day (TID) | ORAL | 0 refills | Status: DC | PRN

## 2019-05-13 NOTE — Telephone Encounter (Signed)
Pt called left name and phone number only. 

## 2019-05-13 NOTE — Telephone Encounter (Signed)
Pt wanting something for pain

## 2019-05-13 NOTE — Telephone Encounter (Signed)
Alexander Atkins states pt has MRI on Monday and [employer] needs PA.

## 2019-05-13 NOTE — Telephone Encounter (Signed)
Sent tramadol to the pharmacy. Will not refill if he does not come in.

## 2019-05-13 NOTE — Telephone Encounter (Signed)
Pt is scheduled for 05/15/2019, but also requested pain medication.

## 2019-05-13 NOTE — Telephone Encounter (Signed)
I called pt and he states he reinjured his leg 05/11/2019. I told pt I felt he needed to be seen in case his problem has worsened or changed due to the new injury. Charmayne Sheer - Scheduler text messages Dr. Jacqualyn Posey. I would call again with instructions. Pt states, "I can't go on like this." I told pt, either the scheduler or I would call with instructions.

## 2019-05-14 NOTE — Telephone Encounter (Signed)
I called [employer] and I called [employer] and both confirm that prior authorization is NOT required for MRI left ankle. I spoke with Tiara at [facility] and let her know. I offered to re-fax the order to her but she said it was not necessary. I faxed the order anyway. Thanks,  [employer]

## 2019-05-15 ENCOUNTER — Ambulatory Visit (INDEPENDENT_AMBULATORY_CARE_PROVIDER_SITE_OTHER): Payer: BC Managed Care – PPO

## 2019-05-15 ENCOUNTER — Ambulatory Visit (INDEPENDENT_AMBULATORY_CARE_PROVIDER_SITE_OTHER): Payer: BC Managed Care – PPO | Admitting: Podiatry

## 2019-05-15 ENCOUNTER — Encounter: Payer: Self-pay | Admitting: Podiatry

## 2019-05-15 ENCOUNTER — Other Ambulatory Visit: Payer: Self-pay

## 2019-05-15 DIAGNOSIS — S99922A Unspecified injury of left foot, initial encounter: Secondary | ICD-10-CM | POA: Diagnosis not present

## 2019-05-15 DIAGNOSIS — S96912S Strain of unspecified muscle and tendon at ankle and foot level, left foot, sequela: Secondary | ICD-10-CM

## 2019-05-15 DIAGNOSIS — S99912A Unspecified injury of left ankle, initial encounter: Secondary | ICD-10-CM | POA: Diagnosis not present

## 2019-05-15 MED ORDER — TRAMADOL HCL 50 MG PO TABS: 50.0000 mg | ORAL_TABLET | Freq: Three times a day (TID) | ORAL | 0 refills | Status: AC | PRN

## 2019-05-17 NOTE — Progress Notes (Signed)
Subjective: 43 year old male presents the office today for concerns of a left ankle injury.  He states that he was wearing the ankle brace however he was walking his ankle gave out twisting his ankle and since then he has had pain to the ankle as well as the back of the ankle and he notes a pulling sensation.  He states he has not been back to work since Monday because he cannot stand at work.  He is scheduled for an MRI on Monday. Denies any systemic complaints such as fevers, chills, nausea, vomiting. No acute changes since last appointment, and no other complaints at this time.   Of note he states he has not been using any drug use recently.  He states that he gets a weekly drug test because he is trying to get custody of his child back.  He is asking for pain medicine today.  Objective: AAO x3, NAD DP/PT pulses palpable bilaterally, CRT less than 3 seconds There is global tenderness to left ankle but most notably most of the pain is in the lateral aspect ankle in the course the peroneal tendons.  There is weakness with eversion.  There is also tenderness on the Achilles tendon but there is no definite tear which is palpable.  He is able to dorsiflex and plantarflex.  Pain in the anterior lateral ankle as well.  No pain in the medial ankle.  + Pain of the fifth metatarsal base or other areas of foot.  No open lesions or pre-ulcerative lesions.  No pain with calf compression, swelling, warmth, erythema  Assessment: Chronic peroneal tendon rupture, acute left ankle injury  Plan: -All treatment options discussed with the patient including all alternatives, risks, complications.  -X-rays obtained reviewed.  No evidence of acute fracture identified today. -Recommend immobilization in cam boot which she has at home.  Continue to ice and elevate.  Prescribed tramadol for pain.  Await MRI on Monday.  Note was provided for him to stay out of work until Monday the 28th. -Patient encouraged to call the  office with any questions, concerns, change in symptoms.   Trula Slade DPM

## 2019-05-18 ENCOUNTER — Other Ambulatory Visit: Payer: Self-pay

## 2019-05-18 ENCOUNTER — Encounter: Payer: Self-pay | Admitting: Neurology

## 2019-05-18 ENCOUNTER — Ambulatory Visit (INDEPENDENT_AMBULATORY_CARE_PROVIDER_SITE_OTHER): Payer: BC Managed Care – PPO | Admitting: Neurology

## 2019-05-18 ENCOUNTER — Other Ambulatory Visit (INDEPENDENT_AMBULATORY_CARE_PROVIDER_SITE_OTHER): Payer: BC Managed Care – PPO

## 2019-05-18 ENCOUNTER — Ambulatory Visit
Admission: RE | Admit: 2019-05-18 | Discharge: 2019-05-18 | Disposition: A | Discharge status: Home / Self Care | Payer: BC Managed Care – PPO | Source: Ambulatory Visit | Attending: Podiatry | Admitting: Podiatry

## 2019-05-18 ENCOUNTER — Ambulatory Visit: Payer: BC Managed Care – PPO | Admitting: Neurology

## 2019-05-18 VITALS — BP 158/89 | HR 80 | Ht 72.0 in | Wt >= 6400 oz

## 2019-05-18 DIAGNOSIS — G629 Polyneuropathy, unspecified: Secondary | ICD-10-CM

## 2019-05-18 DIAGNOSIS — S86312A Strain of muscle(s) and tendon(s) of peroneal muscle group at lower leg level, left leg, initial encounter: Secondary | ICD-10-CM | POA: Diagnosis not present

## 2019-05-18 LAB — COMPREHENSIVE METABOLIC PANEL
ALT: 64 U/L — ABNORMAL HIGH (ref 0–53)
AST: 32 U/L (ref 0–37)
Albumin: 4.1 g/dL (ref 3.5–5.2)
Alkaline Phosphatase: 99 U/L (ref 39–117)
BUN: 13 mg/dL (ref 6–23)
CO2: 28 mEq/L (ref 19–32)
Calcium: 9.4 mg/dL (ref 8.4–10.5)
Chloride: 101 mEq/L (ref 96–112)
Creatinine, Ser: 0.76 mg/dL (ref 0.40–1.50)
GFR: 111.69 mL/min (ref >60.00)
Glucose, Bld: 160 mg/dL — ABNORMAL HIGH (ref 70–99)
Potassium: 4.2 mEq/L (ref 3.5–5.1)
Sodium: 136 mEq/L (ref 135–145)
Total Bilirubin: 0.5 mg/dL (ref 0.2–1.2)
Total Protein: 7.2 g/dL (ref 6.0–8.3)

## 2019-05-18 LAB — B12 AND FOLATE PANEL
Folate: 13.3 ng/mL (ref >5.9)
Vitamin B-12: 270 pg/mL (ref 211–911)

## 2019-05-18 MED ORDER — DULOXETINE HCL 30 MG PO CPEP: 30.0000 mg | ORAL_CAPSULE | Freq: Every day | ORAL | 3 refills | Status: DC

## 2019-05-18 NOTE — Progress Notes (Signed)
[facility] Clinic Note - Initial Visit   Date: 05/18/19  TANIA PERROTT MRN: [id number] DOB: 1976-02-11   Dear Dr. Ardelle Anton:  Thank you for your kind referral of Alexander Atkins for consultation of bilateral feet pain. Although his history is well known to you, please allow [place] to reiterate it for the purpose of our medical record. The patient was accompanied to the clinic by self.    History of Present Illness: Alexander Atkins is a 43 y.o. right-handed male with hepatitis C, morbid obesity, and history of cocaine abuse presenting for evaluation of bilateral feet pain.   Starting 2018, he began having burning over the soles of the feet, as if they are asleep.  Symptoms are constant and not alleviated by anything.  He has tried cold air application which provides some relief.  His balance is not good, especially on uneven ground.  He walks unassisted and has not suffered any falls.  He denies weakness of the feet.  He currently takes gabapentin 900mg  three times daily which provides very mild relief.   He also has left peroneal tendon tear and is scheduled to have MRI of the foot later today.  He has hepatitis C from tattoos and was previously evaluated by hepatologist.   He has gained ~200lb since 2013.  He was incarcerated from 2013 - 2019.    He works in a [employer] and lives with fiance and son.  No history of diabetes.  He was drinking fifth whiskey daily from the age of 2 - 60.  He drinks socially now.   Out-side paper records, electronic medical record, and images have been reviewed where available and summarized as:  Lab Results  Component Value Date   HGBA1C  10/09/2010    5.4 (NOTE)                                                                       According to the ADA Clinical Practice Recommendations for 2011, when HbA1c is used as a screening test:   >=6.5%   Diagnostic of Diabetes Mellitus           (if abnormal result  is  confirmed)  5.7-6.4%   Increased risk of developing Diabetes Mellitus  References:Diagnosis and Classification of Diabetes Mellitus,Diabetes Care,2011,34(Suppl 1):S62-S69 and Standards of Medical Care in         Diabetes - 2011,Diabetes Care,2011,34  (Suppl 1):S11-S61.   Lab Results  Component Value Date   ESRSEDRATE 2 11/19/2018    Past Medical History:  Diagnosis Date  . Hepatitis-C   . MRSA (methicillin resistant staph aureus) culture positive   . Obesity     Past Surgical History:  Procedure Laterality Date  . ABDOMINAL SURGERY    . CARPAL TUNNEL RELEASE Right   . FACIAL RECONSTRUCTION SURGERY       Medications:  Outpatient Encounter Medications as of 05/18/2019  Medication Sig  . acetaminophen (TYLENOL) 500 MG tablet Take 500 mg by mouth every 6 (six) hours as needed.  . gabapentin (NEURONTIN) 300 MG capsule Take 900 mg by mouth 3 (three) times daily.  05/20/2019 ibuprofen (ADVIL) 200 MG tablet Take 200 mg by mouth every 6 (six) hours as needed.  Marland Kitchen  traMADol (ULTRAM) 50 MG tablet Take 1 tablet (50 mg total) by mouth every 8 (eight) hours as needed for up to 5 days.  . [DISCONTINUED] doxycycline (VIBRAMYCIN) 100 MG capsule Take 1 capsule (100 mg total) by mouth 2 (two) times daily. (Patient not taking: Reported on 03/31/2019)  . [DISCONTINUED] fluticasone (FLONASE) 50 MCG/ACT nasal spray Place 2 sprays into both nostrils daily. (Patient not taking: Reported on 03/31/2019)  . [DISCONTINUED] ibuprofen (ADVIL,MOTRIN) 600 MG tablet Take 1 tablet (600 mg total) by mouth every 6 (six) hours as needed. (Patient not taking: Reported on 03/31/2019)  . [DISCONTINUED] loratadine (CLARITIN) 10 MG tablet Take 1 tablet (10 mg total) by mouth daily. (Patient not taking: Reported on 03/31/2019)  . [DISCONTINUED] meloxicam (MOBIC) 15 MG tablet Take 1 tablet (15 mg total) by mouth daily. Take 1 daily with food.  . [DISCONTINUED] oxyCODONE-acetaminophen (PERCOCET/ROXICET) 5-325 MG tablet Take 1 tablet by mouth  every 6 (six) hours as needed for severe pain. (Patient not taking: Reported on 03/31/2019)  . [DISCONTINUED] sulfamethoxazole-trimethoprim (SEPTRA DS) 800-160 MG per tablet Take 1 tablet by mouth 2 (two) times daily. (Patient not taking: Reported on 03/31/2019)   No facility-administered encounter medications on file as of 05/18/2019.    Allergies:  Allergies  Allergen Reactions  . Hydrocodone Itching    Family History: Family History  Problem Relation Age of Onset  . COPD Mother   . Diabetes Mother   . Obesity Mother   . Lung cancer Father   . Bone cancer Father   . Diabetes Father   . Heart disease Father     Social History: Social History   Tobacco Use  . Smoking status: Never Smoker  . Smokeless tobacco: Current User  . Tobacco comment: started at age 22  Substance Use Topics  . Alcohol use: Yes    Comment: denies recent use  . Drug use: No   Social History   Social History Narrative   Right handed   Lives in a single story home with fiance and son    Review of Systems:  CONSTITUTIONAL: No fevers, chills, night sweats, or weight loss.   EYES: No visual changes or eye pain ENT: No hearing changes.  No history of nose bleeds.   RESPIRATORY: No cough, wheezing and shortness of breath.   CARDIOVASCULAR: Negative for chest pain, and palpitations.   GI: Negative for abdominal discomfort, blood in stools or black stools.  No recent change in bowel habits.   GU:  No history of incontinence.   MUSCLOSKELETAL: +history of joint pain or swelling.  No myalgias.   SKIN: Negative for lesions, rash, and itching.   HEMATOLOGY/ONCOLOGY: Negative for prolonged bleeding, bruising easily, and swollen nodes.  No history of cancer.   ENDOCRINE: Negative for cold or heat intolerance, polydipsia or goiter.   PSYCH:  No depression or anxiety symptoms.   NEURO: As Above.   Vital Signs:  BP (!) 158/89   Pulse 80   Ht 6' (1.829 m)   Wt (!) 495 lb (224.5 kg)   SpO2 96%   BMI  67.13 kg/m    General Medical Exam:   General:  Morbidly obese, well appearing, comfortable.   Eyes/ENT: see cranial nerve examination.   Neck:   No carotid bruits. Respiratory:  Clear to auscultation, good air entry bilaterally.   Cardiac:  Regular rate and rhythm, no murmur.   Extremities:  No deformities, edema, or skin discoloration.  Skin:  No rashes or lesions.  Neurological Exam: MENTAL STATUS including orientation to time, place, person, recent and remote memory, attention span and concentration, language, and fund of knowledge is normal.  Speech is not dysarthric.  CRANIAL NERVES: II:  No visual field defects.   III-IV-VI: Pupils equal round and reactive to light.  Normal conjugate, extra-ocular eye movements in all directions of gaze.  No nystagmus.  No ptosis.   V:  Normal facial sensation.    VII:  Normal facial symmetry and movements.   VIII:  Normal hearing and vestibular function.   IX-X:  Normal palatal movement.   XI:  Normal shoulder shrug and head rotation.   XII:  Normal tongue strength and range of motion, no deviation or fasciculation.  MOTOR:  No atrophy, fasciculations or abnormal movements.  No pronator drift.   Upper Extremity:  Right  Left  Deltoid  5/5   5/5   Biceps  5/5   5/5   Triceps  5/5   5/5   Infraspinatus 5/5  5/5  Medial pectoralis 5/5  5/5  Wrist extensors  5/5   5/5   Wrist flexors  5/5   5/5   Finger extensors  5/5   5/5   Finger flexors  5/5   5/5   Dorsal interossei  5/5   5/5   Abductor pollicis  5/5   5/5   Tone (Ashworth scale)  0  0   Lower Extremity:  Right  Left  Hip flexors  5/5   5/5   Hip extensors  5/5   5/5   Adductor 5/5  5/5  Abductor 5/5  5/5  Knee flexors  5/5   5/5   Knee extensors  5/5   5/5   Dorsiflexors  5/5   5/5   Plantarflexors  5/5   5/5   Toe extensors  5/5   5/5   Toe flexors  5/5   5/5   Tone (Ashworth scale)  0  0   MSRs:  Right        Left                  brachioradialis 1+  1+  biceps 1+   1+  triceps 1+  1+  patellar 1+  1+  ankle jerk 0  0  Hoffman no  no  plantar response down  down   SENSORY:  Absent vibration at the great toe, intact at the ankles bilaterally.  Gradient pattern of loss of pin prick and temperature over the distal feet.  Mild sway with Rhomberg testing.  COORDINATION/GAIT: Normal finger-to- nose-finger.   Intact rapid alternating movements bilaterally.  Able to rise from a chair without using arms.  Gait moderately wide-based, stable, unassisted  IMPRESSION: Peripheral neuropathy, unclear etiology.  Risk factors include history of hepatitis C, cocaine abuse, remote history of alcohol use.  His neurological examination shows a distal predominant large fiber peripheral neuropathy. I had extensive discussion with the patient regarding the pathogenesis, etiology, management, and natural course of neuropathy. Neuropathy tends to be slowly progressive, especially if a treatable etiology is not identified.  I would like to test for treatable causes of neuropathy. I discussed that in the vast majority of cases, despite checking for reversible causes, we are unable to find the underlying etiology and management is symptomatic.  Electrodiagnostic testing discussed, but given classic history and history findings, will only consider this if symptoms evolve in an atypical manner.  Further, his body habitus will likely compromise accuracy  of testing.    PLAN/RECOMMENDATIONS:  Check vitamin B12, folate, vitamin B1, SPEP with IFE, CMP Start Cymbalta  daily Continue gabapentin  TID Patient educated on daily foot inspection, fall prevention, and safety precautions around the home.  Return to clinic in 4 months.   Thank you for allowing me to participate in patient's care.  If I can answer any additional questions, I would be pleased to do so.    Sincerely,    Mikal Blasdell K. Allena Katz, DO

## 2019-05-18 NOTE — Patient Instructions (Addendum)
Check labs Your provider has requested that you have labwork completed today. Please go to [facility] ([id number]) on the second floor of this building before leaving the office today. You do not need to check in. If you are not called within 15 minutes please check with the front desk.     Start Cymbalta 30mg  daily  Continue gabapentin 900mg  three times daily  Return to clinic in 4 months

## 2019-05-21 LAB — PROTEIN ELECTROPHORESIS, SERUM
Albumin ELP: 3.9 g/dL (ref 3.8–4.8)
Alpha 1: 0.3 g/dL (ref 0.2–0.3)
Alpha 2: 0.8 g/dL (ref 0.5–0.9)
Beta 2: 0.4 g/dL (ref 0.2–0.5)
Beta Globulin: 0.4 g/dL (ref 0.4–0.6)
Gamma Globulin: 1.2 g/dL (ref 0.8–1.7)
Total Protein: 7 g/dL (ref 6.1–8.1)

## 2019-05-21 LAB — COPPER, SERUM: Copper: 112 ug/dL (ref 70–175)

## 2019-05-21 LAB — IMMUNOFIXATION ELECTROPHORESIS
IgG (Immunoglobin G), Serum: 1263 mg/dL (ref 600–1640)
IgM, Serum: 65 mg/dL (ref 50–300)
Immunofix Electr Int: NOT DETECTED
Immunoglobulin A: 229 mg/dL (ref 47–310)

## 2019-05-21 LAB — VITAMIN B1: Vitamin B1 (Thiamine): 11 nmol/L (ref 8–30)

## 2019-05-25 ENCOUNTER — Other Ambulatory Visit: Payer: Self-pay | Admitting: Podiatry

## 2019-05-25 ENCOUNTER — Telehealth: Payer: Self-pay

## 2019-05-25 MED ORDER — TRAMADOL HCL 50 MG PO TABS: 50.0000 mg | ORAL_TABLET | Freq: Two times a day (BID) | ORAL | 0 refills | Status: AC | PRN

## 2019-05-25 NOTE — Telephone Encounter (Signed)
Patient called this morning for refill of pain medication (tramadol)  Please advise Thanks, Nira Conn

## 2019-05-25 NOTE — Telephone Encounter (Signed)
sent 

## 2019-05-26 ENCOUNTER — Telehealth: Payer: Self-pay

## 2019-05-26 NOTE — Telephone Encounter (Signed)
-----   Message from Alexander Berthold, DO sent at 05/26/2019  8:01 AM EST ----- Please inform patient that his vitamin nb12 level is low-normal and I would like him to start OTC vitamin B12 1021mcg daily.  Remaining labs are normal.  Thanks.

## 2019-05-26 NOTE — Telephone Encounter (Signed)
Patient aware of results and recommendations. °

## 2019-06-08 ENCOUNTER — Telehealth: Payer: Self-pay | Admitting: *Deleted

## 2019-06-08 MED ORDER — TRAMADOL HCL 50 MG PO TABS: 50.0000 mg | ORAL_TABLET | Freq: Three times a day (TID) | ORAL | 0 refills | Status: DC | PRN

## 2019-06-08 MED ORDER — TRAMADOL HCL 50 MG PO TABS: 50.0000 mg | ORAL_TABLET | Freq: Two times a day (BID) | ORAL | 0 refills | Status: AC | PRN

## 2019-06-08 NOTE — Telephone Encounter (Signed)
Pt called states name and he needs pain medication.

## 2019-06-08 NOTE — Telephone Encounter (Signed)
I called the number listed on the phone history for 4:23pm and pt answered. Pt states he had a really bad day at work and the MRI had been reviewed and the doctor can't fix it. Dr. Bary Castilla refill of the tramadol as ordered 05/25/2019.

## 2019-06-08 NOTE — Telephone Encounter (Signed)
Left message with orders for Tramadol.

## 2019-06-16 ENCOUNTER — Telehealth: Payer: Self-pay | Admitting: *Deleted

## 2019-06-16 NOTE — Telephone Encounter (Signed)
Pt called requesting pain medications and he wants more than 10.

## 2019-06-16 NOTE — Telephone Encounter (Signed)
I spoke with pt to discuss the nature of his pain and takes one before work and after work, but he can't get relief. I told pt that he should not take the tramadol and work he should take ibuprofen if he tolerates. Pt states he take tramadol before work with ibuprofen. I told pt I would inform Dr. Ardelle Anton, he may want to see him again.

## 2019-06-17 ENCOUNTER — Other Ambulatory Visit: Payer: Self-pay | Admitting: Podiatry

## 2019-06-17 MED ORDER — TRAMADOL HCL 50 MG PO TABS: 50.0000 mg | ORAL_TABLET | Freq: Two times a day (BID) | ORAL | 0 refills | Status: AC | PRN

## 2019-06-17 NOTE — Telephone Encounter (Signed)
I will only go up to 15. Please have him come in to discuss options. I will send the medication.

## 2019-06-17 NOTE — Telephone Encounter (Signed)
Please have him come in. I didn't say I "couldn't fix it".

## 2019-06-18 NOTE — Telephone Encounter (Signed)
Left message informing pt of Dr. Gabriel Rung refill increased amount and to make an appt to discuss options.

## 2019-08-25 DIAGNOSIS — Z6841 Body Mass Index (BMI) 40.0 and over, adult: Secondary | ICD-10-CM | POA: Diagnosis not present

## 2019-08-25 DIAGNOSIS — G894 Chronic pain syndrome: Secondary | ICD-10-CM | POA: Diagnosis not present

## 2019-08-25 DIAGNOSIS — G4733 Obstructive sleep apnea (adult) (pediatric): Secondary | ICD-10-CM | POA: Diagnosis not present

## 2019-08-25 DIAGNOSIS — G629 Polyneuropathy, unspecified: Secondary | ICD-10-CM | POA: Diagnosis not present

## 2019-08-25 DIAGNOSIS — Z1389 Encounter for screening for other disorder: Secondary | ICD-10-CM | POA: Diagnosis not present

## 2019-08-25 DIAGNOSIS — M5417 Radiculopathy, lumbosacral region: Secondary | ICD-10-CM | POA: Diagnosis not present

## 2019-08-25 DIAGNOSIS — E669 Obesity, unspecified: Secondary | ICD-10-CM | POA: Diagnosis not present

## 2019-09-01 DIAGNOSIS — M545 Low back pain: Secondary | ICD-10-CM | POA: Diagnosis not present

## 2019-09-01 DIAGNOSIS — G63 Polyneuropathy in diseases classified elsewhere: Secondary | ICD-10-CM | POA: Diagnosis not present

## 2019-09-01 DIAGNOSIS — M129 Arthropathy, unspecified: Secondary | ICD-10-CM | POA: Diagnosis not present

## 2019-09-01 DIAGNOSIS — M62830 Muscle spasm of back: Secondary | ICD-10-CM | POA: Diagnosis not present

## 2019-09-01 DIAGNOSIS — E559 Vitamin D deficiency, unspecified: Secondary | ICD-10-CM | POA: Diagnosis not present

## 2019-09-01 DIAGNOSIS — I739 Peripheral vascular disease, unspecified: Secondary | ICD-10-CM | POA: Diagnosis not present

## 2019-09-01 DIAGNOSIS — Z79899 Other long term (current) drug therapy: Secondary | ICD-10-CM | POA: Diagnosis not present

## 2019-09-16 DIAGNOSIS — Z79899 Other long term (current) drug therapy: Secondary | ICD-10-CM | POA: Diagnosis not present

## 2019-09-21 ENCOUNTER — Ambulatory Visit: Payer: BC Managed Care – PPO | Admitting: Neurology

## 2019-09-22 DIAGNOSIS — I10 Essential (primary) hypertension: Secondary | ICD-10-CM | POA: Diagnosis not present

## 2019-09-22 DIAGNOSIS — M545 Low back pain: Secondary | ICD-10-CM | POA: Diagnosis not present

## 2019-09-22 DIAGNOSIS — G4710 Hypersomnia, unspecified: Secondary | ICD-10-CM | POA: Diagnosis not present

## 2019-09-25 ENCOUNTER — Other Ambulatory Visit: Payer: Self-pay | Admitting: Neurology

## 2019-10-15 DIAGNOSIS — Z79899 Other long term (current) drug therapy: Secondary | ICD-10-CM | POA: Diagnosis not present

## 2019-10-15 DIAGNOSIS — M25561 Pain in right knee: Secondary | ICD-10-CM | POA: Diagnosis not present

## 2019-10-15 DIAGNOSIS — G8929 Other chronic pain: Secondary | ICD-10-CM | POA: Diagnosis not present

## 2019-10-15 DIAGNOSIS — M25562 Pain in left knee: Secondary | ICD-10-CM | POA: Diagnosis not present

## 2019-10-15 DIAGNOSIS — M629 Disorder of muscle, unspecified: Secondary | ICD-10-CM | POA: Diagnosis not present

## 2019-10-28 DIAGNOSIS — R0902 Hypoxemia: Secondary | ICD-10-CM | POA: Diagnosis not present

## 2019-10-28 DIAGNOSIS — G4733 Obstructive sleep apnea (adult) (pediatric): Secondary | ICD-10-CM | POA: Diagnosis not present

## 2019-11-13 DIAGNOSIS — M629 Disorder of muscle, unspecified: Secondary | ICD-10-CM | POA: Diagnosis not present

## 2019-11-13 DIAGNOSIS — G8929 Other chronic pain: Secondary | ICD-10-CM | POA: Diagnosis not present

## 2019-11-13 DIAGNOSIS — Z79899 Other long term (current) drug therapy: Secondary | ICD-10-CM | POA: Diagnosis not present

## 2019-11-13 DIAGNOSIS — M25561 Pain in right knee: Secondary | ICD-10-CM | POA: Diagnosis not present

## 2019-11-13 DIAGNOSIS — M25562 Pain in left knee: Secondary | ICD-10-CM | POA: Diagnosis not present

## 2019-11-17 DIAGNOSIS — G63 Polyneuropathy in diseases classified elsewhere: Secondary | ICD-10-CM | POA: Diagnosis not present

## 2019-11-17 DIAGNOSIS — I739 Peripheral vascular disease, unspecified: Secondary | ICD-10-CM | POA: Diagnosis not present

## 2019-11-17 DIAGNOSIS — G4733 Obstructive sleep apnea (adult) (pediatric): Secondary | ICD-10-CM | POA: Diagnosis not present

## 2019-11-17 DIAGNOSIS — I10 Essential (primary) hypertension: Secondary | ICD-10-CM | POA: Diagnosis not present

## 2020-01-25 ENCOUNTER — Ambulatory Visit: Payer: BC Managed Care – PPO | Admitting: Neurology

## 2020-04-27 DEATH — deceased

## 2021-03-20 IMAGING — CT CT OF THE LEFT FOOT WITHOUT CONTRAST
3 series · 9 of 33 positions shown, 11 images · non-contrast
Comparison: Left foot x-rays dated October 20, 2018.

CLINICAL DATA: Lateral left foot pain since twisting injury 2 weeks
ago.

EXAM:
CT OF THE LEFT FOOT WITHOUT CONTRAST
TECHNIQUE: Multidetector CT imaging of the left foot was performed according to
the standard protocol. Multiplanar CT image reconstructions were
also generated.

[Series 5: sfov lower extremity 2.00 br40 s3 ax soft · axial · 0.27mm/px · z∈[+722,+722]mm · 1 of 80 slices shown, 2 images]
[im 43/80  soft-tissue]
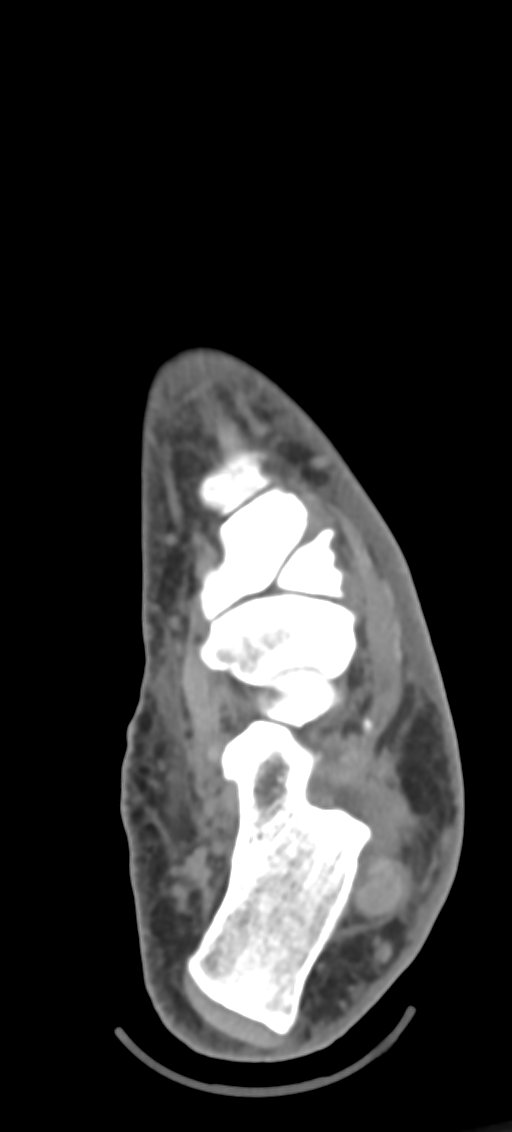
[im 43/80  bone]
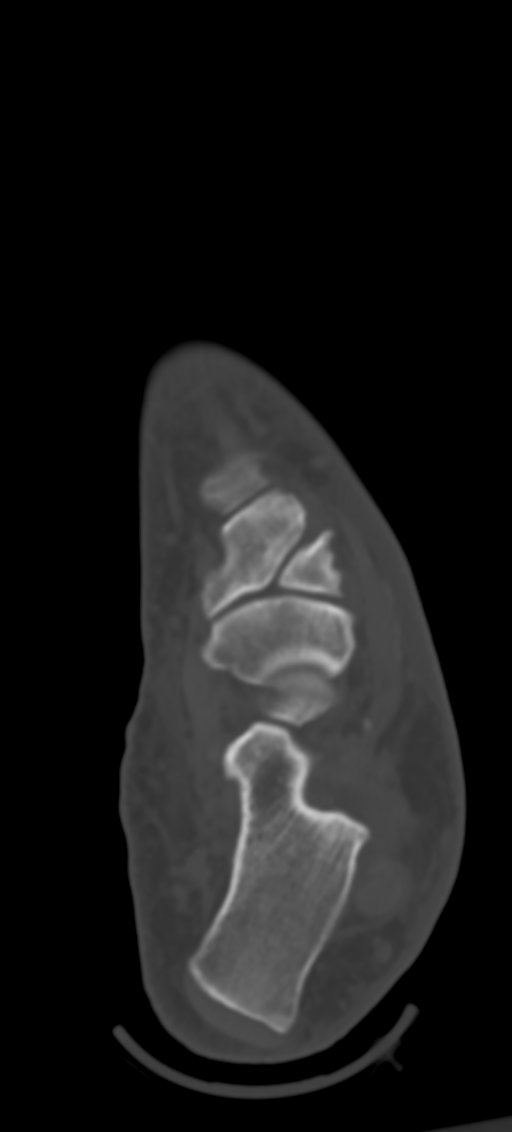

[Series 9: sfov lower extremity 2.00 br40 s3 cor soft · coronal · 0.27mm/px · 3 of 145 slices shown]
[im 29/145  bone]
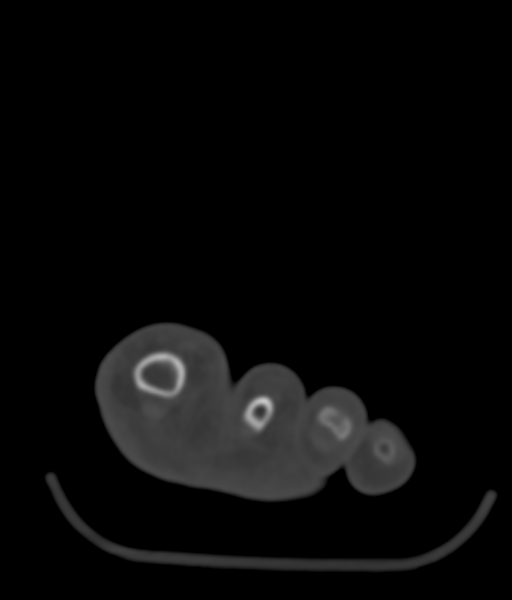
[im 58/145  bone]
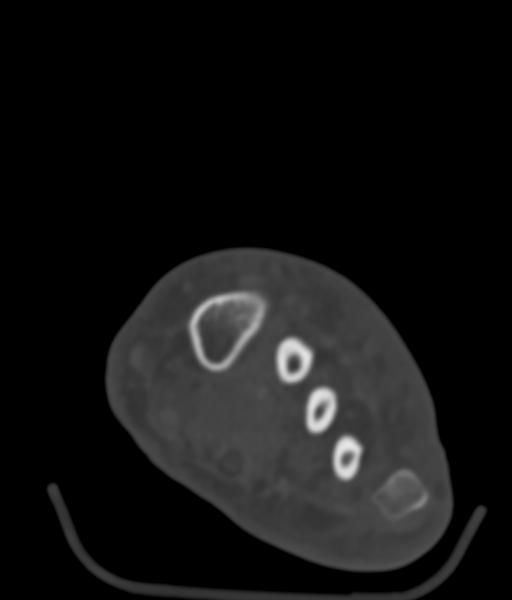
[im 87/145  bone]
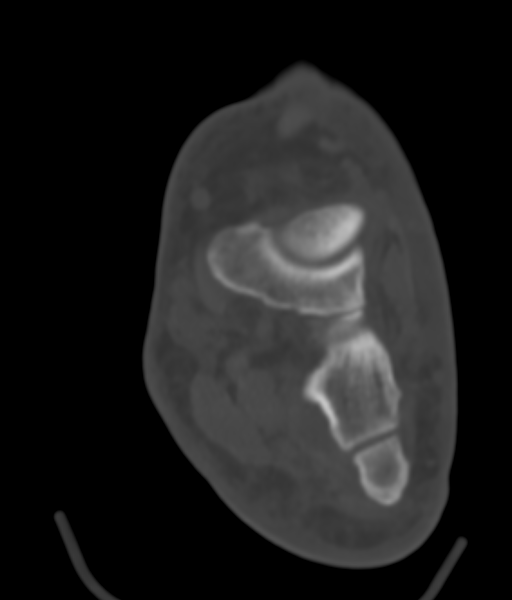

[Series 13: sfov lower extremity 2.00 br40 s3 sag soft · sagittal · 0.31mm/px · 5 of 68 slices shown, 6 images]
[im 23/68  bone]
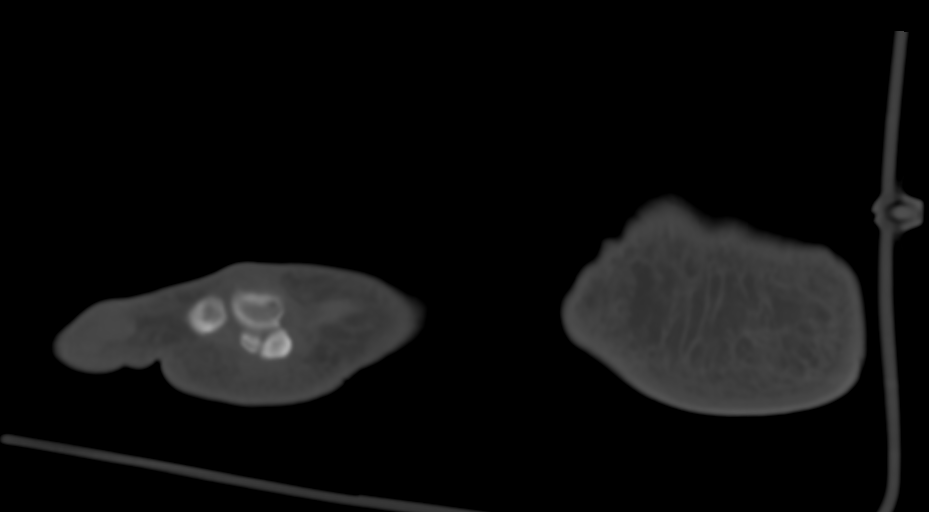
[im 28/68  bone]
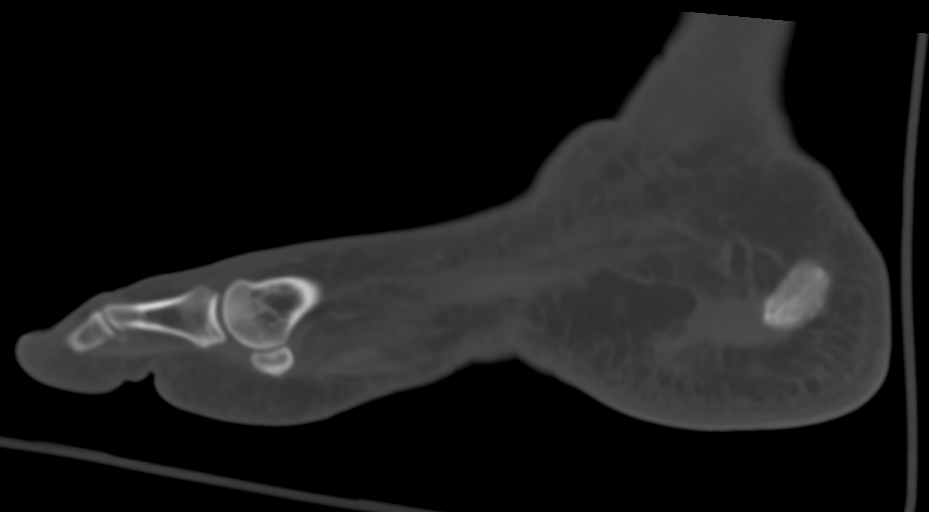
[im 34/68  soft-tissue]
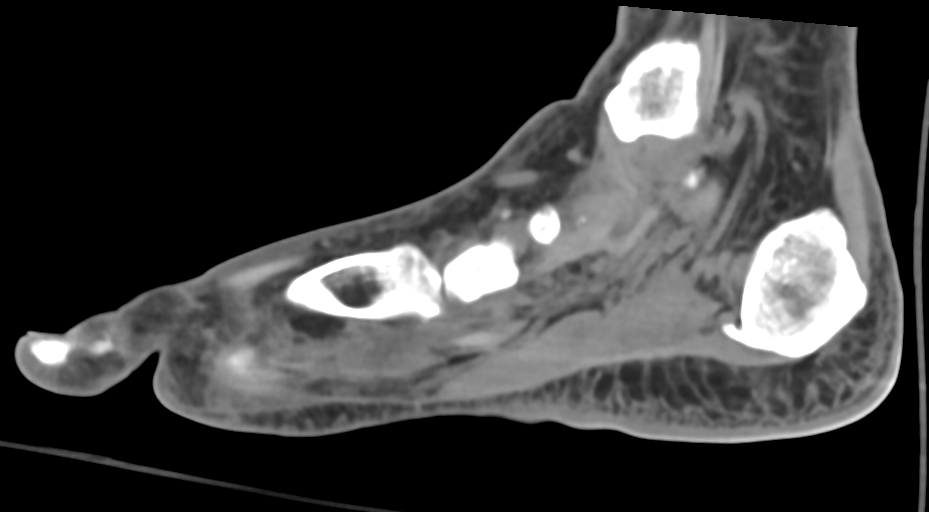
[im 34/68  bone]
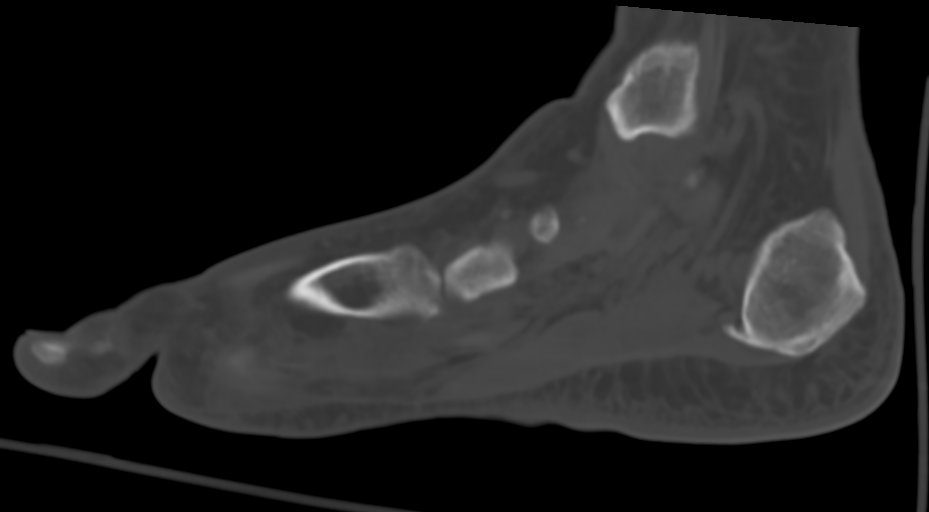
[im 40/68  bone]
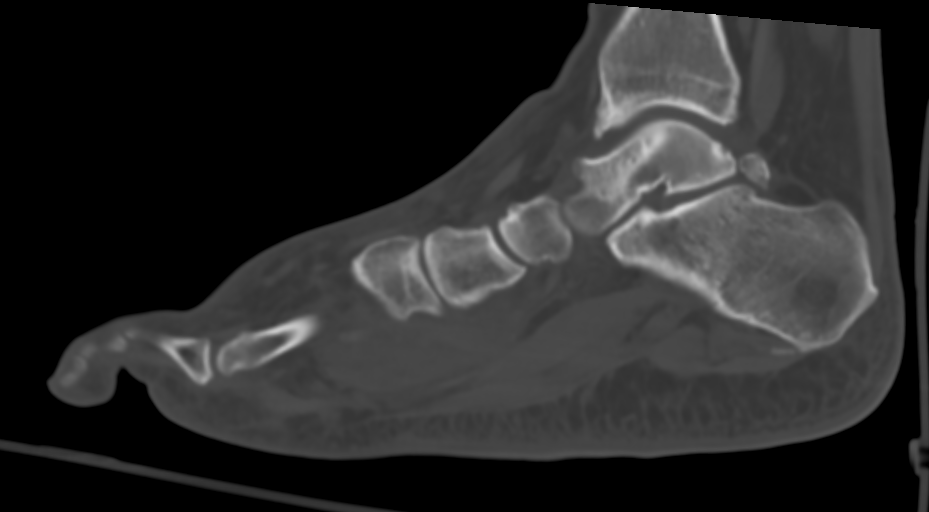
[im 45/68  bone]
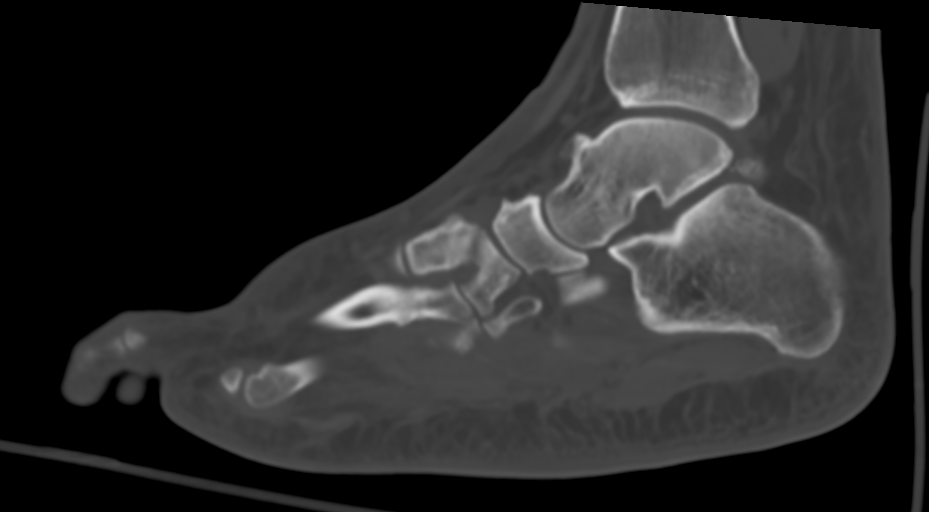

[9 of 33 positions shown; findings below may reference images not displayed]

FINDINGS: Bones/Joint/Cartilage

No acute fracture or dislocation. Old healed fracture of the fifth
metatarsal proximal metaphysis. Well corticated ossific densities
near the tip of the lateral malleolus are consistent with old
avulsion injuries. The ankle mortise is symmetric. The talar dome is
intact. Mild tibiotalar spurring. Dorsal talar hypertrophy at the
anterior ankle capsular attachment. Os trigonum. No joint effusion.
Large plantar enthesophyte.

Ligaments

Suboptimally assessed by CT.

Muscles and Tendons

Complete tear of the peroneal longus tendon with retraction to the
base of the calcaneus. Remaining tendons are grossly intact.

Soft tissues

Lateral hindfoot and forefoot soft tissue swelling. No fluid
collection or soft tissue mass.
IMPRESSION: 1. Complete tear of the peroneal longus tendon with retraction to
the base of the calcaneus.
2. No acute osseous abnormality. Old healed fracture of the proximal
fifth metatarsal. Old avulsion fractures of the lateral malleolus.
